# Patient Record
Sex: Male | Born: 1935 | Race: Black or African American | Hispanic: No | Marital: Married | Smoking: Current every day smoker
Health system: Southern US, Community
[De-identification: ages and names within clinical notes are randomized; demographics above are authoritative.]

## PROBLEM LIST (undated history)

## (undated) DIAGNOSIS — M545 Low back pain, unspecified: Secondary | ICD-10-CM

## (undated) DIAGNOSIS — K219 Gastro-esophageal reflux disease without esophagitis: Secondary | ICD-10-CM

## (undated) DIAGNOSIS — R634 Abnormal weight loss: Secondary | ICD-10-CM

## (undated) DIAGNOSIS — I5020 Unspecified systolic (congestive) heart failure: Secondary | ICD-10-CM

## (undated) DIAGNOSIS — I739 Peripheral vascular disease, unspecified: Secondary | ICD-10-CM

## (undated) DIAGNOSIS — M25539 Pain in unspecified wrist: Secondary | ICD-10-CM

## (undated) DIAGNOSIS — D649 Anemia, unspecified: Secondary | ICD-10-CM

## (undated) DIAGNOSIS — M109 Gout, unspecified: Secondary | ICD-10-CM

## (undated) DIAGNOSIS — I10 Essential (primary) hypertension: Secondary | ICD-10-CM

## (undated) DIAGNOSIS — R197 Diarrhea, unspecified: Secondary | ICD-10-CM

## (undated) DIAGNOSIS — I2510 Atherosclerotic heart disease of native coronary artery without angina pectoris: Secondary | ICD-10-CM

## (undated) DIAGNOSIS — R739 Hyperglycemia, unspecified: Secondary | ICD-10-CM

## (undated) DIAGNOSIS — M25439 Effusion, unspecified wrist: Secondary | ICD-10-CM

## (undated) DIAGNOSIS — H547 Unspecified visual loss: Secondary | ICD-10-CM

## (undated) DIAGNOSIS — K4020 Bilateral inguinal hernia, without obstruction or gangrene, not specified as recurrent: Secondary | ICD-10-CM

## (undated) DIAGNOSIS — B029 Zoster without complications: Secondary | ICD-10-CM

## (undated) DIAGNOSIS — K2100 Gastro-esophageal reflux disease with esophagitis, without bleeding: Secondary | ICD-10-CM

## (undated) DIAGNOSIS — I4821 Permanent atrial fibrillation: Secondary | ICD-10-CM

## (undated) DIAGNOSIS — R5382 Chronic fatigue, unspecified: Secondary | ICD-10-CM

## (undated) DIAGNOSIS — Z1331 Encounter for screening for depression: Secondary | ICD-10-CM

## (undated) DIAGNOSIS — E785 Hyperlipidemia, unspecified: Secondary | ICD-10-CM

## (undated) DIAGNOSIS — D519 Vitamin B12 deficiency anemia, unspecified: Secondary | ICD-10-CM

## (undated) DIAGNOSIS — R7303 Prediabetes: Secondary | ICD-10-CM

## (undated) DIAGNOSIS — E7800 Pure hypercholesterolemia, unspecified: Secondary | ICD-10-CM

## (undated) DIAGNOSIS — N390 Urinary tract infection, site not specified: Secondary | ICD-10-CM

## (undated) DIAGNOSIS — R110 Nausea: Secondary | ICD-10-CM

## (undated) DIAGNOSIS — R1013 Epigastric pain: Secondary | ICD-10-CM

## (undated) DIAGNOSIS — I6523 Occlusion and stenosis of bilateral carotid arteries: Secondary | ICD-10-CM

## (undated) DIAGNOSIS — Z79899 Other long term (current) drug therapy: Secondary | ICD-10-CM

## (undated) DIAGNOSIS — J449 Chronic obstructive pulmonary disease, unspecified: Secondary | ICD-10-CM

## (undated) DIAGNOSIS — L03119 Cellulitis of unspecified part of limb: Secondary | ICD-10-CM

## (undated) DIAGNOSIS — J439 Emphysema, unspecified: Secondary | ICD-10-CM

## (undated) HISTORY — DX: Peripheral vascular disease, unspecified: I73.9

## (undated) HISTORY — DX: Gastro-esophageal reflux disease without esophagitis: K21.9

## (undated) HISTORY — DX: Chronic obstructive pulmonary disease, unspecified: J44.9

## (undated) HISTORY — DX: Cellulitis of unspecified part of limb: L03.119

## (undated) HISTORY — DX: Chronic fatigue, unspecified: R53.82

## (undated) HISTORY — DX: Vitamin B12 deficiency anemia, unspecified: D51.9

## (undated) HISTORY — DX: Anemia, unspecified: D64.9

## (undated) HISTORY — DX: Atherosclerotic heart disease of native coronary artery without angina pectoris: I25.10

## (undated) HISTORY — DX: Urinary tract infection, site not specified: N39.0

## (undated) HISTORY — DX: Occlusion and stenosis of bilateral carotid arteries: I65.23

## (undated) HISTORY — DX: Zoster without complications: B02.9

## (undated) HISTORY — DX: Diarrhea, unspecified: R19.7

## (undated) HISTORY — DX: Emphysema, unspecified: J43.9

## (undated) HISTORY — DX: Encounter for screening for depression: Z13.31

## (undated) HISTORY — DX: Nausea: R11.0

## (undated) HISTORY — DX: Other long term (current) drug therapy: Z79.899

## (undated) HISTORY — DX: Unspecified systolic (congestive) heart failure: I50.20

## (undated) HISTORY — DX: Epigastric pain: R10.13

## (undated) HISTORY — DX: Gastro-esophageal reflux disease with esophagitis: K21.0

## (undated) HISTORY — DX: Abnormal weight loss: R63.4

## (undated) HISTORY — DX: Pain in unspecified wrist: M25.539

## (undated) HISTORY — DX: Unspecified visual loss: H54.7

## (undated) HISTORY — DX: Gout, unspecified: M10.9

## (undated) HISTORY — DX: Hyperlipidemia, unspecified: E78.5

## (undated) HISTORY — DX: Permanent atrial fibrillation: I48.21

## (undated) HISTORY — DX: Essential (primary) hypertension: I10

## (undated) HISTORY — PX: OTHER SURGICAL HISTORY: SHX169

## (undated) HISTORY — PX: GALLBLADDER SURGERY: SHX652

## (undated) HISTORY — DX: Bilateral inguinal hernia, without obstruction or gangrene, not specified as recurrent: K40.20

## (undated) HISTORY — DX: Effusion, unspecified wrist: M25.439

## (undated) HISTORY — DX: Hyperglycemia, unspecified: R73.9

## (undated) HISTORY — DX: Low back pain, unspecified: M54.50

## (undated) HISTORY — DX: Low back pain: M54.5

## (undated) HISTORY — DX: Gastro-esophageal reflux disease with esophagitis, without bleeding: K21.00

## (undated) HISTORY — DX: Prediabetes: R73.03

## (undated) HISTORY — DX: Pure hypercholesterolemia, unspecified: E78.00

---

## 2001-12-12 ENCOUNTER — Inpatient Hospital Stay (HOSPITAL_COMMUNITY): Admission: EM | Admit: 2001-12-12 | Discharge: 2001-12-21 | Payer: Self-pay | Admitting: Cardiology

## 2001-12-15 ENCOUNTER — Encounter: Payer: Self-pay | Admitting: Cardiothoracic Surgery

## 2001-12-16 ENCOUNTER — Encounter: Payer: Self-pay | Admitting: Cardiothoracic Surgery

## 2001-12-17 ENCOUNTER — Encounter: Payer: Self-pay | Admitting: Cardiothoracic Surgery

## 2001-12-18 ENCOUNTER — Encounter: Payer: Self-pay | Admitting: Cardiothoracic Surgery

## 2001-12-18 ENCOUNTER — Encounter: Payer: Self-pay | Admitting: Surgery

## 2002-01-14 ENCOUNTER — Encounter: Payer: Self-pay | Admitting: Cardiothoracic Surgery

## 2002-01-14 ENCOUNTER — Encounter: Admission: RE | Admit: 2002-01-14 | Discharge: 2002-01-14 | Payer: Self-pay | Admitting: Cardiothoracic Surgery

## 2018-01-13 DIAGNOSIS — K08409 Partial loss of teeth, unspecified cause, unspecified class: Secondary | ICD-10-CM | POA: Diagnosis not present

## 2018-01-13 DIAGNOSIS — K219 Gastro-esophageal reflux disease without esophagitis: Secondary | ICD-10-CM | POA: Diagnosis not present

## 2018-01-13 DIAGNOSIS — J309 Allergic rhinitis, unspecified: Secondary | ICD-10-CM | POA: Diagnosis not present

## 2018-01-13 DIAGNOSIS — I252 Old myocardial infarction: Secondary | ICD-10-CM | POA: Diagnosis not present

## 2018-01-13 DIAGNOSIS — I739 Peripheral vascular disease, unspecified: Secondary | ICD-10-CM | POA: Diagnosis not present

## 2018-01-13 DIAGNOSIS — G8929 Other chronic pain: Secondary | ICD-10-CM | POA: Diagnosis not present

## 2018-01-13 DIAGNOSIS — E785 Hyperlipidemia, unspecified: Secondary | ICD-10-CM | POA: Diagnosis not present

## 2018-01-13 DIAGNOSIS — I2510 Atherosclerotic heart disease of native coronary artery without angina pectoris: Secondary | ICD-10-CM | POA: Diagnosis not present

## 2018-01-13 DIAGNOSIS — R69 Illness, unspecified: Secondary | ICD-10-CM | POA: Diagnosis not present

## 2018-01-13 DIAGNOSIS — I10 Essential (primary) hypertension: Secondary | ICD-10-CM | POA: Diagnosis not present

## 2018-03-16 DIAGNOSIS — R739 Hyperglycemia, unspecified: Secondary | ICD-10-CM | POA: Diagnosis not present

## 2018-03-16 DIAGNOSIS — R636 Underweight: Secondary | ICD-10-CM | POA: Diagnosis not present

## 2018-03-16 DIAGNOSIS — I10 Essential (primary) hypertension: Secondary | ICD-10-CM | POA: Diagnosis not present

## 2018-03-16 DIAGNOSIS — R5382 Chronic fatigue, unspecified: Secondary | ICD-10-CM | POA: Diagnosis not present

## 2018-03-16 DIAGNOSIS — R002 Palpitations: Secondary | ICD-10-CM | POA: Diagnosis not present

## 2018-03-16 DIAGNOSIS — D649 Anemia, unspecified: Secondary | ICD-10-CM | POA: Diagnosis not present

## 2018-03-16 DIAGNOSIS — E7800 Pure hypercholesterolemia, unspecified: Secondary | ICD-10-CM | POA: Diagnosis not present

## 2018-03-16 DIAGNOSIS — E538 Deficiency of other specified B group vitamins: Secondary | ICD-10-CM | POA: Diagnosis not present

## 2018-03-16 DIAGNOSIS — Z79899 Other long term (current) drug therapy: Secondary | ICD-10-CM | POA: Diagnosis not present

## 2018-03-16 DIAGNOSIS — I2510 Atherosclerotic heart disease of native coronary artery without angina pectoris: Secondary | ICD-10-CM | POA: Diagnosis not present

## 2018-03-16 DIAGNOSIS — K210 Gastro-esophageal reflux disease with esophagitis: Secondary | ICD-10-CM | POA: Diagnosis not present

## 2018-03-16 DIAGNOSIS — M109 Gout, unspecified: Secondary | ICD-10-CM | POA: Diagnosis not present

## 2018-03-16 DIAGNOSIS — N401 Enlarged prostate with lower urinary tract symptoms: Secondary | ICD-10-CM | POA: Diagnosis not present

## 2018-03-17 ENCOUNTER — Encounter: Payer: Self-pay | Admitting: Cardiology

## 2018-03-23 ENCOUNTER — Encounter: Payer: Self-pay | Admitting: Cardiology

## 2018-03-23 ENCOUNTER — Ambulatory Visit (INDEPENDENT_AMBULATORY_CARE_PROVIDER_SITE_OTHER): Payer: Medicare HMO | Admitting: Cardiology

## 2018-03-23 VITALS — BP 100/68 | HR 78 | Ht 73.0 in | Wt 142.8 lb

## 2018-03-23 DIAGNOSIS — R69 Illness, unspecified: Secondary | ICD-10-CM | POA: Diagnosis not present

## 2018-03-23 DIAGNOSIS — I2510 Atherosclerotic heart disease of native coronary artery without angina pectoris: Secondary | ICD-10-CM | POA: Insufficient documentation

## 2018-03-23 DIAGNOSIS — Z951 Presence of aortocoronary bypass graft: Secondary | ICD-10-CM | POA: Insufficient documentation

## 2018-03-23 DIAGNOSIS — E782 Mixed hyperlipidemia: Secondary | ICD-10-CM | POA: Insufficient documentation

## 2018-03-23 DIAGNOSIS — I4891 Unspecified atrial fibrillation: Secondary | ICD-10-CM

## 2018-03-23 DIAGNOSIS — F17210 Nicotine dependence, cigarettes, uncomplicated: Secondary | ICD-10-CM | POA: Diagnosis not present

## 2018-03-23 DIAGNOSIS — I10 Essential (primary) hypertension: Secondary | ICD-10-CM | POA: Diagnosis not present

## 2018-03-23 HISTORY — DX: Atherosclerotic heart disease of native coronary artery without angina pectoris: I25.10

## 2018-03-23 HISTORY — DX: Essential (primary) hypertension: I10

## 2018-03-23 HISTORY — DX: Unspecified atrial fibrillation: I48.91

## 2018-03-23 HISTORY — DX: Presence of aortocoronary bypass graft: Z95.1

## 2018-03-23 HISTORY — DX: Nicotine dependence, cigarettes, uncomplicated: F17.210

## 2018-03-23 HISTORY — DX: Mixed hyperlipidemia: E78.2

## 2018-03-23 MED ORDER — RIVAROXABAN 15 MG PO TABS: 15.0000 mg | ORAL_TABLET | Freq: Every day | ORAL | 0 refills | Status: DC

## 2018-03-23 MED ORDER — DIGOXIN 125 MCG PO TABS: 0.1250 mg | ORAL_TABLET | ORAL | 1 refills | Status: DC

## 2018-03-23 MED ORDER — METOPROLOL SUCCINATE ER 25 MG PO TB24: 25.0000 mg | ORAL_TABLET | Freq: Every day | ORAL | 2 refills | Status: DC

## 2018-03-23 MED ORDER — DIGOXIN 125 MCG PO TABS: 0.1250 mg | ORAL_TABLET | ORAL | 0 refills | Status: DC

## 2018-03-23 MED ORDER — RIVAROXABAN 15 MG PO TABS: 15.0000 mg | ORAL_TABLET | Freq: Every day | ORAL | 2 refills | Status: DC

## 2018-03-23 NOTE — Patient Instructions (Signed)
Medication Instructions:  Your physician has recommended you make the following change in your medication:  START Xarelto 15 mg every night with supper (please do not take this medication until you have been told the stool sample is clear) START digoxin 0.125 mg every other day CHANGE metoprolol to 25 mg daily  Labwork: None  Testing/Procedures: Your physician has requested that you have an echocardiogram. Echocardiography is a painless test that uses sound waves to create images of your heart. It provides your doctor with information about the size and shape of your heart and how well your heart's chambers and valves are working. This procedure takes approximately one hour. There are no restrictions for this procedure.  Your physician has requested that you have a lexiscan myoview. For further information please visit [email or web address] Please follow instruction sheet, as given.  Follow-Up: Your physician recommends that you schedule a follow-up appointment in: 1 week  Any Other Special Instructions Will Be Listed Below (If Applicable).     If you need a refill on your cardiac medications before your next appointment, please call your pharmacy.   [facility]  Garey Ham, RN, BSN  Echocardiogram An echocardiogram, or echocardiography, uses sound waves (ultrasound) to produce an image of your heart. The echocardiogram is simple, painless, obtained within a short period of time, and offers valuable information to your health care provider. The images from an echocardiogram can provide information such as:  Evidence of coronary artery disease (CAD).  Heart size.  Heart muscle function.  Heart valve function.  Aneurysm detection.  Evidence of a past heart attack.  Fluid buildup around the heart.  Heart muscle thickening.  Assess heart valve function.  Tell a health care provider about:  Any allergies you have.  All medicines you are taking, including vitamins,  herbs, eye drops, creams, and over-the-counter medicines.  Any problems you or family members have had with anesthetic medicines.  Any blood disorders you have.  Any surgeries you have had.  Any medical conditions you have.  Whether you are pregnant or may be pregnant. What happens before the procedure? No special preparation is needed. Eat and drink normally. What happens during the procedure?  In order to produce an image of your heart, gel will be applied to your chest and a wand-like tool (transducer) will be moved over your chest. The gel will help transmit the sound waves from the transducer. The sound waves will harmlessly bounce off your heart to allow the heart images to be captured in real-time motion. These images will then be recorded.  You may need an IV to receive a medicine that improves the quality of the pictures. What happens after the procedure? You may return to your normal schedule including diet, activities, and medicines, unless your health care provider tells you otherwise. This information is not intended to replace advice given to you by your health care provider. Make sure you discuss any questions you have with your health care provider. Document Released: 10/03/2000 Document Revised: 05/24/2016 Document Reviewed: 06/13/2013 Elsevier Interactive Patient Education  2017 Elsevier Inc. Rivaroxaban oral tablets What is this medicine? RIVAROXABAN (ri va ROX a ban) is an anticoagulant (blood thinner). It is used to treat blood clots in the lungs or in the veins. It is also used after knee or hip surgeries to prevent blood clots. It is also used to lower the chance of stroke in people with a medical condition called atrial fibrillation. This medicine may be used for other purposes;  ask your health care provider or pharmacist if you have questions. COMMON BRAND NAME(S): Xarelto, Xarelto Starter Pack What should I tell my health care provider before I take this  medicine? They need to know if you have any of these conditions: -bleeding disorders -bleeding in the brain -blood in your stools (black or tarry stools) or if you have blood in your vomit -history of stomach bleeding -kidney disease -liver disease -low blood counts, like low white cell, platelet, or red cell counts -recent or planned spinal or epidural procedure -take medicines that treat or prevent blood clots -an unusual or allergic reaction to rivaroxaban, other medicines, foods, dyes, or preservatives -pregnant or trying to get pregnant -breast-feeding How should I use this medicine? Take this medicine by mouth with a glass of water. Follow the directions on the prescription label. Take your medicine at regular intervals. Do not take it more often than directed. Do not stop taking except on your doctor's advice. Stopping this medicine may increase your risk of a blood clot. Be sure to refill your prescription before you run out of medicine. If you are taking this medicine after hip or knee replacement surgery, take it with or without food. If you are taking this medicine for atrial fibrillation, take it with your evening meal. If you are taking this medicine to treat blood clots, take it with food at the same time each day. If you are unable to swallow your tablet, you may crush the tablet and mix it in applesauce. Then, immediately eat the applesauce. You should eat more food right after you eat the applesauce containing the crushed tablet. Talk to your pediatrician regarding the use of this medicine in children. Special care may be needed. Overdosage: If you think you have taken too much of this medicine contact a poison control center or emergency room at once. NOTE: This medicine is only for you. Do not share this medicine with others. What if I miss a dose? If you take your medicine once a day and miss a dose, take the missed dose as soon as you remember. If you take your medicine  twice a day and miss a dose, take the missed dose immediately. In this instance, 2 tablets may be taken at the same time. The next day you should take 1 tablet twice a day as directed. What may interact with this medicine? Do not take this medicine with any of the following medications: -defibrotide This medicine may also interact with the following medications: -aspirin and aspirin-like medicines -certain antibiotics like erythromycin, azithromycin, and clarithromycin -certain medicines for fungal infections like ketoconazole and itraconazole -certain medicines for irregular heart beat like amiodarone, quinidine, dronedarone -certain medicines for seizures like carbamazepine, phenytoin -certain medicines that treat or prevent blood clots like warfarin, enoxaparin, and dalteparin -conivaptan -diltiazem -felodipine -indinavir -lopinavir; ritonavir -NSAIDS, medicines for pain and inflammation, like ibuprofen or naproxen -ranolazine -rifampin -ritonavir -SNRIs, medicines for depression, like desvenlafaxine, duloxetine, levomilnacipran, venlafaxine -SSRIs, medicines for depression, like citalopram, escitalopram, fluoxetine, fluvoxamine, paroxetine, sertraline -St. John's wort -verapamil This list may not describe all possible interactions. Give your health care provider a list of all the medicines, herbs, non-prescription drugs, or dietary supplements you use. Also tell them if you smoke, drink alcohol, or use illegal drugs. Some items may interact with your medicine. What should I watch for while using this medicine? Visit your doctor or health care professional for regular checks on your progress. Notify your doctor or health care professional and seek  emergency treatment if you develop breathing problems; changes in vision; chest pain; severe, sudden headache; pain, swelling, warmth in the leg; trouble speaking; sudden numbness or weakness of the face, arm or leg. These can be signs that  your condition has gotten worse. If you are going to have surgery or other procedure, tell your doctor that you are taking this medicine. What side effects may I notice from receiving this medicine? Side effects that you should report to your doctor or health care professional as soon as possible: -allergic reactions like skin rash, itching or hives, swelling of the face, lips, or tongue -back pain -redness, blistering, peeling or loosening of the skin, including inside the mouth -signs and symptoms of bleeding such as bloody or black, tarry stools; red or dark-brown urine; spitting up blood or brown material that looks like coffee grounds; red spots on the skin; unusual bruising or bleeding from the eye, gums, or nose Side effects that usually do not require medical attention (report to your doctor or health care professional if they continue or are bothersome): -dizziness -muscle pain This list may not describe all possible side effects. Call your doctor for medical advice about side effects. You may report side effects to FDA at 1-800-FDA-1088. Where should I keep my medicine? Keep out of the reach of children. Store at room temperature between 15 and 30 degrees C (59 and 86 degrees F). Throw away any unused medicine after the expiration date. NOTE: This sheet is a summary. It may not cover all possible information. If you have questions about this medicine, talk to your doctor, pharmacist, or health care provider.  2018 Elsevier/Gold Standard (2016-06-25 16:29:33) Digoxin tablets or capsules What is this medicine? DIGOXIN (di JOX in) is used to treat congestive heart failure and heart rhythm problems. This medicine may be used for other purposes; ask your health care provider or pharmacist if you have questions. COMMON BRAND NAME(S): Digitek, Lanoxicaps, Lanoxin What should I tell my health care provider before I take this medicine? They need to know if you have any of these  conditions: -certain heart rhythm disorders -heart disease or recent heart attack -kidney or liver disease -an unusual or allergic reaction to digoxin, other medicines, foods, dyes, or preservatives -pregnant or trying to get pregnant -breast-feeding How should I use this medicine? Take this medicine by mouth with a glass of water. Follow the directions on the prescription label. Take your doses at regular intervals. Do not take your medicine more often than directed. Talk to your pediatrician regarding the use of this medicine in children. Special care may be needed. Overdosage: If you think you have taken too much of this medicine contact a poison control center or emergency room at once. NOTE: This medicine is only for you. Do not share this medicine with others. What if I miss a dose? If you miss a dose, take it as soon as you can. If it is almost time for your next dose, take only that dose. Do not take double or extra doses. What may interact with this medicine? -activated charcoal -albuterol -alprazolam -antacids -antiviral medicines for HIV or AIDS like ritonavir and saquinavir -calcium -certain antibiotics like azithromycin, clarithromycin, erythromycin, gentamicin, neomycin, trimethoprim, and tetracycline -certain medicines for blood pressure, heart disease, irregular heart beat -certain medicines for cancer -certain medicines for cholesterol like atorvastatin, cholestyramine, and colestipol -certain medicines for diabetes, like acarbose, exenatide, miglitol, and metformin -certain medicines for fungal infections like ketoconazole and itraconazole -certain medicines for  stomach problems like omeprazole, esomeprazole, lansoprazole, rabeprazole, metoclopramide, and sucralfate -conivaptan -cyclosporine -diphenoxylate -epinephrine -kaolin; pectin -nefazodone -NSAIDS, medicines for pain and inflammation, like celecoxib, ibuprofen, or  naproxen -penicillamine -phenytoin -propantheline -quinine -phenytoin -rifampin -succinylcholine -St. John's Wort -sulfasalazine -teriparatide -thyroid hormones -tolvaptan This list may not describe all possible interactions. Give your health care provider a list of all the medicines, herbs, non-prescription drugs, or dietary supplements you use. Also tell them if you smoke, drink alcohol, or use illegal drugs. Some items may interact with your medicine. What should I watch for while using this medicine? Visit your doctor or health care professional for regular checks on your progress. Do not stop taking this medicine without the advice of your doctor or health care professional, even if you feel better. Do not change the brand you are taking, other brands may affect you differently. Check your heart rate and blood pressure regularly while you are taking this medicine. Ask your doctor or health care professional what your heart rate and blood pressure should be, and when you should contact him or her. Your doctor or health care professional also may schedule regular blood tests and electrocardiograms to check your progress. Watch your diet. Less digoxin may be absorbed from the stomach if you have a diet high in bran fiber. Do not treat yourself for coughs, colds or allergies without asking your doctor or health care professional for advice. Some ingredients can increase possible side effects. What side effects may I notice from receiving this medicine? Side effects that you should report to your doctor or health care professional as soon as possible: -allergic reactions like skin rash, itching or hives, swelling of the face, lips, or tongue -changes in behavior, mood, or mental ability -changes in vision -confusion -fast, irregular heartbeat -feeling faint or lightheaded, falls -headache -nausea, vomiting -unusual bleeding, bruising -unusually weak or tired Side effects that usually  do not require medical attention (report to your doctor or health care professional if they continue or are bothersome): -breast enlargement in men and women -diarrhea This list may not describe all possible side effects. Call your doctor for medical advice about side effects. You may report side effects to FDA at 1-800-FDA-1088. Where should I keep my medicine? Keep out of the reach of children. Store at room temperature between 15 and 30 degrees C (59 and 86 degrees F). Protect from light and moisture. Throw away any unused medicine after the expiration date. NOTE: This sheet is a summary. It may not cover all possible information. If you have questions about this medicine, talk to your doctor, pharmacist, or health care provider.  2018 Elsevier/Gold Standard (2016-09-24 15:40:26)

## 2018-03-23 NOTE — Progress Notes (Signed)
Cardiology Office Note:    Date:  03/23/2018   ID:  LAKER THOMPSON, DOB 1936-08-04, MRN [id number]  PCP:  Guadalupe Maple., MD  Cardiologist:  Garwin Brothers, MD   Referring MD: Guadalupe Maple., MD    ASSESSMENT:    1. Atrial fibrillation, unspecified type (HCC)   2. Coronary artery disease involving native coronary artery of native heart without angina pectoris   3. Hx of CABG   4. Essential hypertension   5. Mixed dyslipidemia   6. Cigarette smoker    PLAN:    In order of problems listed above:   Secondary prevention stressed with the patient.  Importance of compliance with diet and medications stressed and he vocalized understanding. I spent 5 minutes with the patient discussing solely about smoking. Smoking cessation was counseled. I suggested to the patient also different medications and pharmacological interventions. Patient is keen to try stopping on its own at this time. He will get back to me if he needs any further assistance in this matter. In view of history of coronary artery disease he will undergo Lexiscan sestamibi testing.  Echocardiogram will be done to assess murmur heard on auscultation. I discussed with the patient atrial fibrillation, disease process. Management and therapy including rate and rhythm control, anticoagulation benefits and potential risks were discussed extensively with the patient. Patient had multiple questions which were answered to patient's satisfaction. He has been initiated on 15 mg of Xarelto on a daily basis.  He mentions to me that he has no history of bleeding from any part of the body.  We will check his stool for occult blood.  He will also keep a close monitoring this. Patient's metoprolol has been reduced to half dose in view of what appears to be symptoms suggesting postural hypotension.  Fall precautions were advised.  I started him on digoxin 0.125 mg on every other day.  I am aware of the fact that he has renal insufficiency and  therefore I am starting him on a smaller dose. He will be seen in follow-up appointment in a week or earlier if he has any concerns he knows to go to the nearest emergency room for any significant concerns.  His lipids will be managed by his primary care physician.   Medication Adjustments/Labs and Tests Ordered: Current medicines are reviewed at length with the patient today.  Concerns regarding medicines are outlined above.  No orders of the defined types were placed in this encounter.  No orders of the defined types were placed in this encounter.    History of Present Illness:    Colin Reeves is a 82 y.o. male who is being seen today for the evaluation of newly diagnosed atrial fibrillation at the request of Guadalupe Maple., MD.  Patient is a pleasant 82 year old male.  He has past medical history of essential hypertension and coronary artery disease.  He is undergone CABG surgery in the remote past.  He mentions to me that he was diagnosed to have irregular heartbeat and sent here for evaluation.  No chest pain orthopnea or PND.  He mentions to me that when he sits up he feels a little lightheaded.  No history of syncope.  No orthopnea or PND.  No chest pain.  At the time of my evaluation, the patient is alert awake oriented and in no distress.  He is seen with his granddaughter at this visit.  He has history of renal insufficiency.  Unfortunately is a  chronic smoker and continues to smoke.  Past Medical History:  Diagnosis Date  . Acute lower urinary tract infection   . Anemia of unknown etiology   . Anemia, vitamin B12 deficiency   . CAD (coronary artery disease), native coronary artery   . Cellulitis of arm   . Chronic fatigue   . Chronic reflux esophagitis   . COPD, moderate (HCC)   . Depression screening   . Diarrhea   . Elevated blood sugar   . Epigastric pain   . Esophageal reflux   . Gout   . Hyperlipidemia   . Hypertension, essential, benign   . HZ (herpes zoster)    . Inguinal hernia, bilateral   . Long-term use of high-risk medication   . Lumbago   . Nausea   . Pure hypercholesterolemia   . Vision loss   . Weight loss   . Wrist joint pain   . Wrist swelling     Past Surgical History:  Procedure Laterality Date  . GALLBLADDER SURGERY    . triple bypass      Current Medications: Current Meds  Medication Sig  . allopurinol (ZYLOPRIM) 300 MG tablet Take 300 mg by mouth daily.  Marland Kitchen aspirin 81 MG EC tablet Take 81 mg by mouth daily. Swallow whole.  Marland Kitchen atorvastatin (LIPITOR) 80 MG tablet Take 80 mg by mouth daily.  . colchicine 0.6 MG tablet Take 0.6 mg by mouth 4 (four) times daily.  Marland Kitchen loperamide (IMODIUM) 2 MG capsule Take 2 mg by mouth every 4 (four) hours as needed for diarrhea or loose stools.  . metoprolol succinate (TOPROL-XL) 50 MG 24 hr tablet Take 50 mg by mouth daily. Take with or immediately following a meal.  . nitroGLYCERIN (NITROSTAT) 0.4 MG SL tablet Place 0.4 mg under the tongue every 5 (five) minutes as needed.  Marland Kitchen omeprazole (PRILOSEC) 20 MG capsule Take 20 mg by mouth 2 (two) times daily.  . ondansetron (ZOFRAN) 4 MG tablet Take 4 mg by mouth every 4 (four) hours as needed for nausea.  . tamsulosin (FLOMAX) 0.4 MG CAPS capsule Take 0.4 mg by mouth daily.     Allergies:   Patient has no known allergies.   Social History   Socioeconomic History  . Marital status: Married    Spouse name: Not on file  . Number of children: Not on file  . Years of education: Not on file  . Highest education level: Not on file  Occupational History  . Not on file  Social Needs  . Financial resource strain: Not on file  . Food insecurity:    Worry: Not on file    Inability: Not on file  . Transportation needs:    Medical: Not on file    Non-medical: Not on file  Tobacco Use  . Smoking status: Current Every Day Smoker  . Smokeless tobacco: Never Used  Substance and Sexual Activity  . Alcohol use: Not on file  . Drug use: Not on file    . Sexual activity: Not on file  Lifestyle  . Physical activity:    Days per week: Not on file    Minutes per session: Not on file  . Stress: Not on file  Relationships  . Social connections:    Talks on phone: Not on file    Gets together: Not on file    Attends religious service: Not on file    Active member of club or organization: Not on file  Attends meetings of clubs or organizations: Not on file    Relationship status: Not on file  Other Topics Concern  . Not on file  Social History Narrative  . Not on file     Family History: The patient's family history includes Cancer in his mother; Diabetes in his father and sister; Throat cancer in his brother.  ROS:   Please see the history of present illness.    All other systems reviewed and are negative.  EKGs/Labs/Other Studies Reviewed:    The following studies were reviewed today: I discussed my evaluation of records from primary care physician office.   Recent Labs: No results found for requested labs within last 8760 hours.  Recent Lipid Panel No results found for: CHOL, TRIG, HDL, CHOLHDL, VLDL, LDLCALC, LDLDIRECT  Physical Exam:    VS:  BP 100/68 (BP Location: Right Arm, Patient Position: Sitting, Cuff Size: Normal)   Pulse 78   Ht 6\' 1"  (1.854 m)   Wt 142 lb 12.8 oz (64.8 kg)   SpO2 97%   BMI 18.84 kg/m     Wt Readings from Last 3 Encounters:  03/23/18 142 lb 12.8 oz (64.8 kg)     GEN: Patient is in no acute distress HEENT: Normal NECK: No JVD; No carotid bruits LYMPHATICS: No lymphadenopathy CARDIAC: S1 S2 irregular, 2/6 systolic murmur at the apex. RESPIRATORY:  Clear to auscultation without rales, wheezing or rhonchi  ABDOMEN: Soft, non-tender, non-distended MUSCULOSKELETAL:  No edema; No deformity  SKIN: Warm and dry NEUROLOGIC:  Alert and oriented x 3 PSYCHIATRIC:  Normal affect    Signed, Garwin Brothersajan R Brienne Liguori, MD  03/23/2018 11:27 AM    [facility]

## 2018-03-23 NOTE — Addendum Note (Signed)
Addended by: Craige CottaANDERSON, ASHLEY S on: 03/23/2018 11:55 AM   Modules accepted: Orders

## 2018-03-24 ENCOUNTER — Telehealth (HOSPITAL_COMMUNITY): Payer: Self-pay | Admitting: *Deleted

## 2018-03-24 NOTE — Telephone Encounter (Signed)
Daughter of patient given detailed instructions per DPR of Myocardial Perfusion Study Information Sheet for the test on 03/26/18 at 10:15. Patient notified to arrive 15 minutes early and that it is imperative to arrive on time for appointment to keep from having the test rescheduled.  If you need to cancel or reschedule your appointment, please call the office within 24 hours of your appointment. . Patient verbalized understanding.Colin DolinSharon S Brooks

## 2018-03-26 ENCOUNTER — Ambulatory Visit (HOSPITAL_COMMUNITY): Payer: Medicare HMO | Attending: Internal Medicine

## 2018-03-26 ENCOUNTER — Other Ambulatory Visit: Payer: Self-pay

## 2018-03-26 ENCOUNTER — Ambulatory Visit (HOSPITAL_BASED_OUTPATIENT_CLINIC_OR_DEPARTMENT_OTHER): Payer: Medicare HMO

## 2018-03-26 VITALS — Ht 73.0 in | Wt 142.0 lb

## 2018-03-26 DIAGNOSIS — I2510 Atherosclerotic heart disease of native coronary artery without angina pectoris: Secondary | ICD-10-CM

## 2018-03-26 DIAGNOSIS — I4891 Unspecified atrial fibrillation: Secondary | ICD-10-CM | POA: Diagnosis not present

## 2018-03-26 DIAGNOSIS — I081 Rheumatic disorders of both mitral and tricuspid valves: Secondary | ICD-10-CM | POA: Diagnosis not present

## 2018-03-26 DIAGNOSIS — Z720 Tobacco use: Secondary | ICD-10-CM | POA: Insufficient documentation

## 2018-03-26 DIAGNOSIS — Z951 Presence of aortocoronary bypass graft: Secondary | ICD-10-CM

## 2018-03-26 DIAGNOSIS — I519 Heart disease, unspecified: Secondary | ICD-10-CM | POA: Insufficient documentation

## 2018-03-26 LAB — MYOCARDIAL PERFUSION IMAGING
CHL CUP NUCLEAR SDS: 0
CHL CUP NUCLEAR SSS: 3
CSEPPHR: 125 {beats}/min
LV dias vol: 104 mL (ref 62–150)
LV sys vol: 67 mL
NUC STRESS TID: 0.89
RATE: 0.29
Rest HR: 82 {beats}/min
SRS: 3

## 2018-03-26 IMAGING — NM NM MISC PROCEDURE
5 series · 30 of 30 positions shown · non-contrast
Comparison: none

[Series 1: wbr_r-proj_st rest · 6.51mm/px · 6 of 64 frames shown]
[frame 6/64]
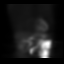
[frame 16/64]
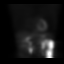
[frame 27/64]
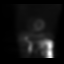
[frame 38/64]
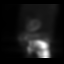
[frame 48/64]
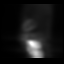
[frame 59/64]
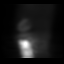

[Series 1: rest · 6.51mm/px · 6 of 64 frames shown]
[frame 6/64]
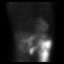
[frame 16/64]
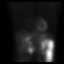
[frame 27/64]
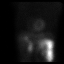
[frame 38/64]
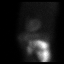
[frame 48/64]
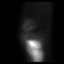
[frame 59/64]
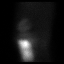

[Series 2: stress - gated · 6.51mm/px · 6 of 512 frames shown]
[frame 43/512]
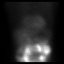
[frame 128/512]
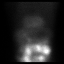
[frame 214/512]
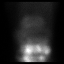
[frame 299/512]
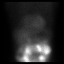
[frame 384/512]
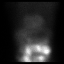
[frame 470/512]
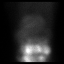

[Series 2: wbr_s-proj_st stress - gated · 6.51mm/px · 6 of 512 frames shown]
[frame 43/512]
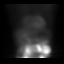
[frame 128/512]
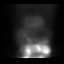
[frame 214/512]
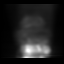
[frame 299/512]
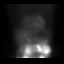
[frame 384/512]
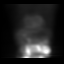
[frame 470/512]
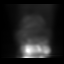

[Series 3: stress - perfusion · 6.51mm/px · 6 of 64 frames shown]
[frame 6/64]
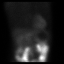
[frame 16/64]
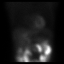
[frame 27/64]
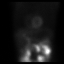
[frame 38/64]
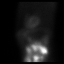
[frame 48/64]
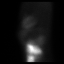
[frame 59/64]
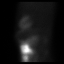

[30 of 30 positions shown; findings below may reference images not displayed]

Canned report from images found in remote index.

Refer to host system for actual result text.

## 2018-03-26 MED ORDER — TECHNETIUM TC 99M TETROFOSMIN IV KIT
10.9000 | PACK | Freq: Once | INTRAVENOUS | Status: AC | PRN
  Administered 2018-03-26: 10.9 via INTRAVENOUS
  Filled 2018-03-26: qty 11

## 2018-03-26 MED ORDER — TECHNETIUM TC 99M TETROFOSMIN IV KIT
30.1000 | PACK | Freq: Once | INTRAVENOUS | Status: AC | PRN
  Administered 2018-03-26: 30.1 via INTRAVENOUS
  Filled 2018-03-26: qty 31

## 2018-03-26 MED ORDER — REGADENOSON 0.4 MG/5ML IV SOLN
0.4000 mg | Freq: Once | INTRAVENOUS | Status: AC
  Administered 2018-03-26: 0.4 mg via INTRAVENOUS

## 2018-03-29 ENCOUNTER — Telehealth: Payer: Self-pay | Admitting: Cardiology

## 2018-03-29 ENCOUNTER — Telehealth: Payer: Self-pay

## 2018-03-29 NOTE — Telephone Encounter (Signed)
Patient informed of results of echocardiogram and stress test. Advised patient Dr. Tomie Chinaevankar would discuss results in detail at appointment tomorrow morning. Patient verbalized understanding. No further questions.

## 2018-03-29 NOTE — Telephone Encounter (Signed)
-----   Message from Garwin Brothersajan R Revankar, MD sent at 03/26/2018  4:58 PM EDT ----- His ejection fraction is better by echocardiogram.  No change in therapy at this time.  I think I am seeing him fairly soon an appointment and will discuss details.  Cc PCP Garwin Brothersajan R Revankar, MD 03/26/2018 4:58 PM

## 2018-03-29 NOTE — Telephone Encounter (Addendum)
Attempted to contact patient for results of stress test and echocardiogram. No answer, no voicemail. If patient does not return call, has an appointment scheduled tomorrow with Dr. Tomie Chinaevankar and will discuss then.

## 2018-03-29 NOTE — Telephone Encounter (Signed)
See previous telephone encounter.

## 2018-03-29 NOTE — Telephone Encounter (Signed)
Patient returned your call for results °

## 2018-03-30 ENCOUNTER — Ambulatory Visit (INDEPENDENT_AMBULATORY_CARE_PROVIDER_SITE_OTHER): Payer: Medicare HMO | Admitting: Cardiology

## 2018-03-30 ENCOUNTER — Encounter: Payer: Self-pay | Admitting: Cardiology

## 2018-03-30 VITALS — BP 112/72 | HR 73 | Ht 73.0 in | Wt 144.0 lb

## 2018-03-30 DIAGNOSIS — I2510 Atherosclerotic heart disease of native coronary artery without angina pectoris: Secondary | ICD-10-CM

## 2018-03-30 DIAGNOSIS — I4891 Unspecified atrial fibrillation: Secondary | ICD-10-CM

## 2018-03-30 DIAGNOSIS — F17210 Nicotine dependence, cigarettes, uncomplicated: Secondary | ICD-10-CM | POA: Diagnosis not present

## 2018-03-30 DIAGNOSIS — Z951 Presence of aortocoronary bypass graft: Secondary | ICD-10-CM

## 2018-03-30 DIAGNOSIS — I10 Essential (primary) hypertension: Secondary | ICD-10-CM | POA: Diagnosis not present

## 2018-03-30 DIAGNOSIS — R69 Illness, unspecified: Secondary | ICD-10-CM | POA: Diagnosis not present

## 2018-03-30 DIAGNOSIS — E782 Mixed hyperlipidemia: Secondary | ICD-10-CM | POA: Diagnosis not present

## 2018-03-30 NOTE — Patient Instructions (Addendum)
Medication Instructions:  Your physician recommends that you continue on your current medications as directed. Please refer to the Current Medication list given to you today.  Please START xarelto 15 mg daily today Please START digoxin 0.125 mg every other day today  Labwork: None  Testing/Procedures: None  Follow-Up: Your physician recommends that you schedule a follow-up appointment in: 2 weeks  Any Other Special Instructions Will Be Listed Below (If Applicable).     If you need a refill on your cardiac medications before your next appointment, please call your pharmacy.   [facility]  Garey HamAshley A, RN, BSN

## 2018-03-30 NOTE — Progress Notes (Signed)
Cardiology Office Note:    Date:  03/30/2018   ID:  Colin Reeves, DOB 12/13/1935, MRN [id number]  PCP:  Guadalupe Maple., MD  Cardiologist:  Garwin Brothers, MD   Referring MD: Guadalupe Maple., MD    ASSESSMENT:    No diagnosis found. PLAN:    In order of problems listed above:  1. Secondary prevention stressed with the patient.  Importance of compliance with diet and medications stressed and he vocalized understanding.  His blood pressure is stable and better than last time. 2. Echocardiogram and stress test report were discussed with the patient at length. 3. His blood pressure is stable.  I have asked him to initiate digoxin 0.125 mg every other day and he is agreeable to that.  He will be seen in follow-up appointment in 2 weeks or earlier if he has any concerns.  His stool guaiac results are yet to be available.  His hemoglobin appears stable and he has not noticed any black stools or any form of blood loss from any orifice in the body and therefore I have asked him to initiate Xarelto 15 mg daily.   Medication Adjustments/Labs and Tests Ordered: Current medicines are reviewed at length with the patient today.  Concerns regarding medicines are outlined above.  No orders of the defined types were placed in this encounter.  No orders of the defined types were placed in this encounter.    No chief complaint on file.    History of Present Illness:    Colin Reeves is a 82 y.o. male.  The patient was evaluated by me for atrial fibrillation.  Unfortunately he has not taken his digoxin and did not pick up the medicine from the pharmacy.  He denies any problems at this time he is family member is with him today.  He tells me that he is kept himself well-hydrated and is taking better care of himself and he feels much better.  No chest pain orthopnea or PND.  Time  Past Medical History:  Diagnosis Date  . Acute lower urinary tract infection   . Anemia of unknown etiology    . Anemia, vitamin B12 deficiency   . CAD (coronary artery disease), native coronary artery   . Cellulitis of arm   . Chronic fatigue   . Chronic reflux esophagitis   . COPD, moderate (HCC)   . Depression screening   . Diarrhea   . Elevated blood sugar   . Epigastric pain   . Esophageal reflux   . Gout   . Hyperlipidemia   . Hypertension, essential, benign   . HZ (herpes zoster)   . Inguinal hernia, bilateral   . Long-term use of high-risk medication   . Lumbago   . Nausea   . Pure hypercholesterolemia   . Vision loss   . Weight loss   . Wrist joint pain   . Wrist swelling     Past Surgical History:  Procedure Laterality Date  . GALLBLADDER SURGERY    . triple bypass      Current Medications: Current Meds  Medication Sig  . allopurinol (ZYLOPRIM) 300 MG tablet Take 300 mg by mouth daily.  Marland Kitchen aspirin 81 MG EC tablet Take 81 mg by mouth daily. Swallow whole.  Marland Kitchen atorvastatin (LIPITOR) 80 MG tablet Take 80 mg by mouth daily.  . colchicine 0.6 MG tablet Take 0.6 mg by mouth 4 (four) times daily.  . digoxin (LANOXIN) 0.125 MG tablet Take 1 tablet (  0.125 mg total) by mouth every other day.  . loperamide (IMODIUM) 2 MG capsule Take 2 mg by mouth every 4 (four) hours as needed for diarrhea or loose stools.  . metoprolol succinate (TOPROL-XL) 25 MG 24 hr tablet Take 1 tablet (25 mg total) by mouth daily.  . nitroGLYCERIN (NITROSTAT) 0.4 MG SL tablet Place 0.4 mg under the tongue every 5 (five) minutes as needed.  Marland Kitchen omeprazole (PRILOSEC) 20 MG capsule Take 20 mg by mouth 2 (two) times daily.  . ondansetron (ZOFRAN) 4 MG tablet Take 4 mg by mouth every 4 (four) hours as needed for nausea.  . Rivaroxaban (XARELTO) 15 MG TABS tablet Take 1 tablet (15 mg total) by mouth daily with supper.  . tamsulosin (FLOMAX) 0.4 MG CAPS capsule Take 0.4 mg by mouth daily.     Allergies:   Patient has no known allergies.   Social History   Socioeconomic History  . Marital status: Married     Spouse name: Not on file  . Number of children: Not on file  . Years of education: Not on file  . Highest education level: Not on file  Occupational History  . Not on file  Social Needs  . Financial resource strain: Not on file  . Food insecurity:    Worry: Not on file    Inability: Not on file  . Transportation needs:    Medical: Not on file    Non-medical: Not on file  Tobacco Use  . Smoking status: Current Every Day Smoker  . Smokeless tobacco: Never Used  Substance and Sexual Activity  . Alcohol use: Not on file  . Drug use: Not on file  . Sexual activity: Not on file  Lifestyle  . Physical activity:    Days per week: Not on file    Minutes per session: Not on file  . Stress: Not on file  Relationships  . Social connections:    Talks on phone: Not on file    Gets together: Not on file    Attends religious service: Not on file    Active member of club or organization: Not on file    Attends meetings of clubs or organizations: Not on file    Relationship status: Not on file  Other Topics Concern  . Not on file  Social History Narrative  . Not on file     Family History: The patient's family history includes Cancer in his mother; Diabetes in his father and sister; Throat cancer in his brother.  ROS:   Please see the history of present illness.    All other systems reviewed and are negative.  EKGs/Labs/Other Studies Reviewed:    The following studies were reviewed today: EKG reveals atrial fibrillation with nonspecific ST-T changes.  Results of stress test and echocardiogram were discussed with the patient at length.   Recent Labs: No results found for requested labs within last 8760 hours.  Recent Lipid Panel No results found for: CHOL, TRIG, HDL, CHOLHDL, VLDL, LDLCALC, LDLDIRECT  Physical Exam:    VS:  BP 112/72 (BP Location: Right Arm, Patient Position: Sitting, Cuff Size: Normal)   Pulse 73   Ht 6\' 1"  (1.854 m)   Wt 144 lb (65.3 kg)   SpO2 98%    BMI 19.00 kg/m     Wt Readings from Last 3 Encounters:  03/30/18 144 lb (65.3 kg)  03/26/18 142 lb (64.4 kg)  03/23/18 142 lb 12.8 oz (64.8 kg)  GEN: Patient is in no acute distress HEENT: Normal NECK: No JVD; No carotid bruits LYMPHATICS: No lymphadenopathy CARDIAC: Hear sounds regular, 2/6 systolic murmur at the apex. RESPIRATORY:  Clear to auscultation without rales, wheezing or rhonchi  ABDOMEN: Soft, non-tender, non-distended MUSCULOSKELETAL:  No edema; No deformity  SKIN: Warm and dry NEUROLOGIC:  Alert and oriented x 3 PSYCHIATRIC:  Normal affect   Signed, Garwin Brothersajan R Revankar, MD  03/30/2018 11:01 AM    [facility]

## 2018-04-15 ENCOUNTER — Encounter: Payer: Self-pay | Admitting: Cardiology

## 2018-04-15 ENCOUNTER — Ambulatory Visit: Payer: Medicare HMO | Admitting: Cardiology

## 2018-04-15 VITALS — BP 120/60 | HR 65 | Ht 73.0 in | Wt 144.0 lb

## 2018-04-15 DIAGNOSIS — E782 Mixed hyperlipidemia: Secondary | ICD-10-CM

## 2018-04-15 DIAGNOSIS — N401 Enlarged prostate with lower urinary tract symptoms: Secondary | ICD-10-CM | POA: Diagnosis not present

## 2018-04-15 DIAGNOSIS — Z951 Presence of aortocoronary bypass graft: Secondary | ICD-10-CM

## 2018-04-15 DIAGNOSIS — R69 Illness, unspecified: Secondary | ICD-10-CM | POA: Diagnosis not present

## 2018-04-15 DIAGNOSIS — F17210 Nicotine dependence, cigarettes, uncomplicated: Secondary | ICD-10-CM | POA: Diagnosis not present

## 2018-04-15 DIAGNOSIS — Z9181 History of falling: Secondary | ICD-10-CM | POA: Diagnosis not present

## 2018-04-15 DIAGNOSIS — R0602 Shortness of breath: Secondary | ICD-10-CM | POA: Diagnosis not present

## 2018-04-15 DIAGNOSIS — I2510 Atherosclerotic heart disease of native coronary artery without angina pectoris: Secondary | ICD-10-CM | POA: Diagnosis not present

## 2018-04-15 DIAGNOSIS — I482 Chronic atrial fibrillation, unspecified: Secondary | ICD-10-CM

## 2018-04-15 DIAGNOSIS — I4891 Unspecified atrial fibrillation: Secondary | ICD-10-CM | POA: Diagnosis not present

## 2018-04-15 DIAGNOSIS — I10 Essential (primary) hypertension: Secondary | ICD-10-CM | POA: Diagnosis not present

## 2018-04-15 DIAGNOSIS — Z681 Body mass index (BMI) 19 or less, adult: Secondary | ICD-10-CM | POA: Diagnosis not present

## 2018-04-15 DIAGNOSIS — Z1331 Encounter for screening for depression: Secondary | ICD-10-CM | POA: Diagnosis not present

## 2018-04-15 NOTE — Patient Instructions (Signed)
Medication Instructions:  Your physician recommends that you continue on your current medications as directed. Please refer to the Current Medication list given to you today.  Labwork: None  Testing/Procedures: None  Follow-Up: Your physician recommends that you schedule a follow-up appointment in: 6 months  Any Other Special Instructions Will Be Listed Below (If Applicable).     If you need a refill on your cardiac medications before your next appointment, please call your pharmacy.   [facility]  Vastie Douty A, RN, BSN  

## 2018-04-15 NOTE — Progress Notes (Signed)
Cardiology Office Note:    Date:  04/15/2018   ID:  Colin Reeves, DOB 12-20-1935, MRN [id number]  PCP:  Colin MapleGage, John F., MD  Cardiologist:  Colin Brothersajan R Revankar, MD   Referring MD: Colin MapleGage, John F., MD    ASSESSMENT:    1. Coronary artery disease involving native coronary artery of native heart without angina pectoris   2. Essential hypertension   3. Chronic atrial fibrillation (HCC)   4. Hx of CABG   5. Mixed dyslipidemia   6. Cigarette smoker    PLAN:    In order of problems listed above:  1. Secondary prevention stressed with the patient.  Importance of compliance with diet and medications stressed and he vocalized understanding.  His blood pressure is stable.  He is not telling me that he takes his medications very regularly. 2. I discussed my findings with the patient and his daughter about results of the echocardiogram and stress test.  We will continue digoxin.  In view of her shortness of breath issues I have asked him to quit smoking and also get established with a pulmonologist for complete evaluation. 3. I spent 5 minutes with the patient discussing solely about smoking. Smoking cessation was counseled. I suggested to the patient also different medications and pharmacological interventions. Patient is keen to try stopping on its own at this time. He will get back to me if he needs any further assistance in this matter. 4. Patient will be seen in follow-up appointment in 6 months or earlier if the patient has any concerns    Medication Adjustments/Labs and Tests Ordered: Current medicines are reviewed at length with the patient today.  Concerns regarding medicines are outlined above.  Orders Placed This Encounter  Procedures  . Fecal occult blood, imunochemical   No orders of the defined types were placed in this encounter.    Chief Complaint  Patient presents with  . 2 Week Follow-up    Still having dizziness and Shortness of breath     History of Present  Illness:    Colin Reeves is a 82 y.o. male.  Patient has history of coronary artery disease and atrial fibrillation.  Unfortunately he continues to smoke.  He denies any problems at this time and takes care of activities of daily living.  No chest pain orthopnea or PND.  He tells me that his palpitations are better.  He does have shortness of breath on exertion which has been steady over the past several months to years.  He does not have a pulmonologist.  Past Medical History:  Diagnosis Date  . Acute lower urinary tract infection   . Anemia of unknown etiology   . Anemia, vitamin B12 deficiency   . CAD (coronary artery disease), native coronary artery   . Cellulitis of arm   . Chronic fatigue   . Chronic reflux esophagitis   . COPD, moderate (HCC)   . Depression screening   . Diarrhea   . Elevated blood sugar   . Epigastric pain   . Esophageal reflux   . Gout   . Hyperlipidemia   . Hypertension, essential, benign   . HZ (herpes zoster)   . Inguinal hernia, bilateral   . Long-term use of high-risk medication   . Lumbago   . Nausea   . Pure hypercholesterolemia   . Vision loss   . Weight loss   . Wrist joint pain   . Wrist swelling     Past Surgical History:  Procedure Laterality Date  . GALLBLADDER SURGERY    . triple bypass      Current Medications: Current Meds  Medication Sig  . allopurinol (ZYLOPRIM) 300 MG tablet Take 300 mg by mouth daily.  Marland Kitchen aspirin 81 MG EC tablet Take 81 mg by mouth daily. Swallow whole.  Marland Kitchen atorvastatin (LIPITOR) 80 MG tablet Take 80 mg by mouth daily.  . colchicine 0.6 MG tablet Take 0.6 mg by mouth 4 (four) times daily.  . digoxin (LANOXIN) 0.125 MG tablet Take 1 tablet (0.125 mg total) by mouth every other day.  . loperamide (IMODIUM) 2 MG capsule Take 2 mg by mouth every 4 (four) hours as needed for diarrhea or loose stools.  . metoprolol succinate (TOPROL-XL) 25 MG 24 hr tablet Take 1 tablet (25 mg total) by mouth daily.  .  nitroGLYCERIN (NITROSTAT) 0.4 MG SL tablet Place 0.4 mg under the tongue every 5 (five) minutes as needed.  Marland Kitchen omeprazole (PRILOSEC) 20 MG capsule Take 20 mg by mouth 2 (two) times daily.  . ondansetron (ZOFRAN) 4 MG tablet Take 4 mg by mouth every 4 (four) hours as needed for nausea.  . Rivaroxaban (XARELTO) 15 MG TABS tablet Take 1 tablet (15 mg total) by mouth daily with supper.  . tamsulosin (FLOMAX) 0.4 MG CAPS capsule Take 0.4 mg by mouth daily.     Allergies:   Patient has no known allergies.   Social History   Socioeconomic History  . Marital status: Married    Spouse name: Not on file  . Number of children: Not on file  . Years of education: Not on file  . Highest education level: Not on file  Occupational History  . Not on file  Social Needs  . Financial resource strain: Not on file  . Food insecurity:    Worry: Not on file    Inability: Not on file  . Transportation needs:    Medical: Not on file    Non-medical: Not on file  Tobacco Use  . Smoking status: Current Every Day Smoker  . Smokeless tobacco: Never Used  Substance and Sexual Activity  . Alcohol use: Not on file  . Drug use: Not on file  . Sexual activity: Not on file  Lifestyle  . Physical activity:    Days per week: Not on file    Minutes per session: Not on file  . Stress: Not on file  Relationships  . Social connections:    Talks on phone: Not on file    Gets together: Not on file    Attends religious service: Not on file    Active member of club or organization: Not on file    Attends meetings of clubs or organizations: Not on file    Relationship status: Not on file  Other Topics Concern  . Not on file  Social History Narrative  . Not on file     Family History: The patient's family history includes Cancer in his mother; Diabetes in his father and sister; Throat cancer in his brother.  ROS:   Please see the history of present illness.    All other systems reviewed and are  negative.  EKGs/Labs/Other Studies Reviewed:    The following studies were reviewed today: I discussed the findings of the echocardiogram and stress test with the patient at extensive length and he and his daughter had multiple questions which were answered to their satisfaction.   Recent Labs: No results found for requested labs within  last 8760 hours.  Recent Lipid Panel No results found for: CHOL, TRIG, HDL, CHOLHDL, VLDL, LDLCALC, LDLDIRECT  Physical Exam:    VS:  BP 120/60   Pulse 65   Ht 6\' 1"  (1.854 m)   Wt 144 lb (65.3 kg)   SpO2 98%   BMI 19.00 kg/m     Wt Readings from Last 3 Encounters:  04/15/18 144 lb (65.3 kg)  03/30/18 144 lb (65.3 kg)  03/26/18 142 lb (64.4 kg)     GEN: Patient is in no acute distress HEENT: Normal NECK: No JVD; No carotid bruits LYMPHATICS: No lymphadenopathy CARDIAC: Hear sounds regular, 2/6 systolic murmur at the apex. RESPIRATORY:  Clear to auscultation without rales, wheezing or rhonchi  ABDOMEN: Soft, non-tender, non-distended MUSCULOSKELETAL:  No edema; No deformity  SKIN: Warm and dry NEUROLOGIC:  Alert and oriented x 3 PSYCHIATRIC:  Normal affect   Signed, Colin Brothers, MD  04/15/2018 11:11 AM    [facility]

## 2018-04-28 DIAGNOSIS — N183 Chronic kidney disease, stage 3 (moderate): Secondary | ICD-10-CM | POA: Diagnosis not present

## 2018-04-28 DIAGNOSIS — Z681 Body mass index (BMI) 19 or less, adult: Secondary | ICD-10-CM | POA: Diagnosis not present

## 2018-04-28 DIAGNOSIS — J449 Chronic obstructive pulmonary disease, unspecified: Secondary | ICD-10-CM | POA: Diagnosis not present

## 2018-04-28 DIAGNOSIS — Z1339 Encounter for screening examination for other mental health and behavioral disorders: Secondary | ICD-10-CM | POA: Diagnosis not present

## 2018-04-28 DIAGNOSIS — N401 Enlarged prostate with lower urinary tract symptoms: Secondary | ICD-10-CM | POA: Diagnosis not present

## 2018-04-28 DIAGNOSIS — I2510 Atherosclerotic heart disease of native coronary artery without angina pectoris: Secondary | ICD-10-CM | POA: Diagnosis not present

## 2018-04-28 DIAGNOSIS — I4891 Unspecified atrial fibrillation: Secondary | ICD-10-CM | POA: Diagnosis not present

## 2018-05-31 DIAGNOSIS — I10 Essential (primary) hypertension: Secondary | ICD-10-CM | POA: Diagnosis not present

## 2018-05-31 DIAGNOSIS — N183 Chronic kidney disease, stage 3 (moderate): Secondary | ICD-10-CM | POA: Diagnosis not present

## 2018-05-31 DIAGNOSIS — G8194 Hemiplegia, unspecified affecting left nondominant side: Secondary | ICD-10-CM | POA: Diagnosis not present

## 2018-05-31 DIAGNOSIS — E559 Vitamin D deficiency, unspecified: Secondary | ICD-10-CM | POA: Diagnosis not present

## 2018-05-31 DIAGNOSIS — N401 Enlarged prostate with lower urinary tract symptoms: Secondary | ICD-10-CM | POA: Diagnosis not present

## 2018-05-31 DIAGNOSIS — K210 Gastro-esophageal reflux disease with esophagitis: Secondary | ICD-10-CM | POA: Diagnosis not present

## 2018-05-31 DIAGNOSIS — I4891 Unspecified atrial fibrillation: Secondary | ICD-10-CM | POA: Diagnosis not present

## 2018-05-31 DIAGNOSIS — I2510 Atherosclerotic heart disease of native coronary artery without angina pectoris: Secondary | ICD-10-CM | POA: Diagnosis not present

## 2018-05-31 DIAGNOSIS — J449 Chronic obstructive pulmonary disease, unspecified: Secondary | ICD-10-CM | POA: Diagnosis not present

## 2018-05-31 DIAGNOSIS — G459 Transient cerebral ischemic attack, unspecified: Secondary | ICD-10-CM | POA: Diagnosis not present

## 2018-06-18 DIAGNOSIS — I639 Cerebral infarction, unspecified: Secondary | ICD-10-CM | POA: Diagnosis not present

## 2018-06-18 DIAGNOSIS — M160 Bilateral primary osteoarthritis of hip: Secondary | ICD-10-CM | POA: Diagnosis not present

## 2018-06-18 DIAGNOSIS — R42 Dizziness and giddiness: Secondary | ICD-10-CM | POA: Diagnosis not present

## 2018-06-18 DIAGNOSIS — M25552 Pain in left hip: Secondary | ICD-10-CM | POA: Diagnosis not present

## 2018-07-05 DIAGNOSIS — I4891 Unspecified atrial fibrillation: Secondary | ICD-10-CM | POA: Diagnosis not present

## 2018-07-05 DIAGNOSIS — E559 Vitamin D deficiency, unspecified: Secondary | ICD-10-CM | POA: Diagnosis not present

## 2018-07-05 DIAGNOSIS — M109 Gout, unspecified: Secondary | ICD-10-CM | POA: Diagnosis not present

## 2018-07-05 DIAGNOSIS — E7800 Pure hypercholesterolemia, unspecified: Secondary | ICD-10-CM | POA: Diagnosis not present

## 2018-07-05 DIAGNOSIS — N401 Enlarged prostate with lower urinary tract symptoms: Secondary | ICD-10-CM | POA: Diagnosis not present

## 2018-07-05 DIAGNOSIS — G459 Transient cerebral ischemic attack, unspecified: Secondary | ICD-10-CM | POA: Diagnosis not present

## 2018-07-05 DIAGNOSIS — J449 Chronic obstructive pulmonary disease, unspecified: Secondary | ICD-10-CM | POA: Diagnosis not present

## 2018-07-05 DIAGNOSIS — I10 Essential (primary) hypertension: Secondary | ICD-10-CM | POA: Diagnosis not present

## 2018-07-05 DIAGNOSIS — Z23 Encounter for immunization: Secondary | ICD-10-CM | POA: Diagnosis not present

## 2018-07-05 DIAGNOSIS — K210 Gastro-esophageal reflux disease with esophagitis: Secondary | ICD-10-CM | POA: Diagnosis not present

## 2018-08-09 DIAGNOSIS — Z681 Body mass index (BMI) 19 or less, adult: Secondary | ICD-10-CM | POA: Diagnosis not present

## 2018-08-09 DIAGNOSIS — I4891 Unspecified atrial fibrillation: Secondary | ICD-10-CM | POA: Diagnosis not present

## 2018-08-09 DIAGNOSIS — N401 Enlarged prostate with lower urinary tract symptoms: Secondary | ICD-10-CM | POA: Diagnosis not present

## 2018-08-09 DIAGNOSIS — M109 Gout, unspecified: Secondary | ICD-10-CM | POA: Diagnosis not present

## 2018-08-09 DIAGNOSIS — J449 Chronic obstructive pulmonary disease, unspecified: Secondary | ICD-10-CM | POA: Diagnosis not present

## 2018-08-09 DIAGNOSIS — E7800 Pure hypercholesterolemia, unspecified: Secondary | ICD-10-CM | POA: Diagnosis not present

## 2018-08-09 DIAGNOSIS — I2510 Atherosclerotic heart disease of native coronary artery without angina pectoris: Secondary | ICD-10-CM | POA: Diagnosis not present

## 2018-08-09 DIAGNOSIS — Z716 Tobacco abuse counseling: Secondary | ICD-10-CM | POA: Diagnosis not present

## 2018-08-09 DIAGNOSIS — N183 Chronic kidney disease, stage 3 (moderate): Secondary | ICD-10-CM | POA: Diagnosis not present

## 2018-08-09 DIAGNOSIS — K210 Gastro-esophageal reflux disease with esophagitis: Secondary | ICD-10-CM | POA: Diagnosis not present

## 2018-10-05 DIAGNOSIS — N401 Enlarged prostate with lower urinary tract symptoms: Secondary | ICD-10-CM | POA: Diagnosis not present

## 2018-10-05 DIAGNOSIS — M109 Gout, unspecified: Secondary | ICD-10-CM | POA: Diagnosis not present

## 2018-10-05 DIAGNOSIS — K210 Gastro-esophageal reflux disease with esophagitis: Secondary | ICD-10-CM | POA: Diagnosis not present

## 2018-10-05 DIAGNOSIS — N183 Chronic kidney disease, stage 3 (moderate): Secondary | ICD-10-CM | POA: Diagnosis not present

## 2018-10-05 DIAGNOSIS — E7800 Pure hypercholesterolemia, unspecified: Secondary | ICD-10-CM | POA: Diagnosis not present

## 2018-10-05 DIAGNOSIS — Z681 Body mass index (BMI) 19 or less, adult: Secondary | ICD-10-CM | POA: Diagnosis not present

## 2018-10-05 DIAGNOSIS — I4891 Unspecified atrial fibrillation: Secondary | ICD-10-CM | POA: Diagnosis not present

## 2018-10-05 DIAGNOSIS — I2510 Atherosclerotic heart disease of native coronary artery without angina pectoris: Secondary | ICD-10-CM | POA: Diagnosis not present

## 2018-10-14 DIAGNOSIS — K210 Gastro-esophageal reflux disease with esophagitis: Secondary | ICD-10-CM | POA: Diagnosis not present

## 2018-10-14 DIAGNOSIS — I2510 Atherosclerotic heart disease of native coronary artery without angina pectoris: Secondary | ICD-10-CM | POA: Diagnosis not present

## 2018-10-14 DIAGNOSIS — E7800 Pure hypercholesterolemia, unspecified: Secondary | ICD-10-CM | POA: Diagnosis not present

## 2018-10-14 DIAGNOSIS — Z716 Tobacco abuse counseling: Secondary | ICD-10-CM | POA: Diagnosis not present

## 2018-10-14 DIAGNOSIS — J449 Chronic obstructive pulmonary disease, unspecified: Secondary | ICD-10-CM | POA: Diagnosis not present

## 2018-10-14 DIAGNOSIS — Z681 Body mass index (BMI) 19 or less, adult: Secondary | ICD-10-CM | POA: Diagnosis not present

## 2018-10-14 DIAGNOSIS — I4891 Unspecified atrial fibrillation: Secondary | ICD-10-CM | POA: Diagnosis not present

## 2018-10-14 DIAGNOSIS — M109 Gout, unspecified: Secondary | ICD-10-CM | POA: Diagnosis not present

## 2018-10-14 DIAGNOSIS — N401 Enlarged prostate with lower urinary tract symptoms: Secondary | ICD-10-CM | POA: Diagnosis not present

## 2018-11-04 DIAGNOSIS — Z681 Body mass index (BMI) 19 or less, adult: Secondary | ICD-10-CM | POA: Diagnosis not present

## 2018-11-04 DIAGNOSIS — E7800 Pure hypercholesterolemia, unspecified: Secondary | ICD-10-CM | POA: Diagnosis not present

## 2018-11-04 DIAGNOSIS — N401 Enlarged prostate with lower urinary tract symptoms: Secondary | ICD-10-CM | POA: Diagnosis not present

## 2018-11-04 DIAGNOSIS — K210 Gastro-esophageal reflux disease with esophagitis: Secondary | ICD-10-CM | POA: Diagnosis not present

## 2018-11-04 DIAGNOSIS — Z716 Tobacco abuse counseling: Secondary | ICD-10-CM | POA: Diagnosis not present

## 2018-11-04 DIAGNOSIS — I4891 Unspecified atrial fibrillation: Secondary | ICD-10-CM | POA: Diagnosis not present

## 2018-11-04 DIAGNOSIS — I2510 Atherosclerotic heart disease of native coronary artery without angina pectoris: Secondary | ICD-10-CM | POA: Diagnosis not present

## 2018-11-04 DIAGNOSIS — N183 Chronic kidney disease, stage 3 (moderate): Secondary | ICD-10-CM | POA: Diagnosis not present

## 2018-11-04 DIAGNOSIS — J449 Chronic obstructive pulmonary disease, unspecified: Secondary | ICD-10-CM | POA: Diagnosis not present

## 2018-11-19 ENCOUNTER — Ambulatory Visit: Payer: Medicare HMO | Admitting: Cardiology

## 2019-05-17 DIAGNOSIS — N183 Chronic kidney disease, stage 3 (moderate): Secondary | ICD-10-CM | POA: Diagnosis not present

## 2019-05-17 DIAGNOSIS — J449 Chronic obstructive pulmonary disease, unspecified: Secondary | ICD-10-CM | POA: Diagnosis not present

## 2019-05-17 DIAGNOSIS — N401 Enlarged prostate with lower urinary tract symptoms: Secondary | ICD-10-CM | POA: Diagnosis not present

## 2019-05-17 DIAGNOSIS — R3914 Feeling of incomplete bladder emptying: Secondary | ICD-10-CM | POA: Diagnosis not present

## 2019-05-17 DIAGNOSIS — Z1331 Encounter for screening for depression: Secondary | ICD-10-CM | POA: Diagnosis not present

## 2019-05-17 DIAGNOSIS — Z9181 History of falling: Secondary | ICD-10-CM | POA: Diagnosis not present

## 2019-05-17 DIAGNOSIS — K210 Gastro-esophageal reflux disease with esophagitis: Secondary | ICD-10-CM | POA: Diagnosis not present

## 2019-05-17 DIAGNOSIS — I4891 Unspecified atrial fibrillation: Secondary | ICD-10-CM | POA: Diagnosis not present

## 2019-05-17 DIAGNOSIS — M109 Gout, unspecified: Secondary | ICD-10-CM | POA: Diagnosis not present

## 2019-05-17 DIAGNOSIS — E7800 Pure hypercholesterolemia, unspecified: Secondary | ICD-10-CM | POA: Diagnosis not present

## 2019-05-18 DIAGNOSIS — E559 Vitamin D deficiency, unspecified: Secondary | ICD-10-CM | POA: Diagnosis not present

## 2019-05-18 DIAGNOSIS — E7800 Pure hypercholesterolemia, unspecified: Secondary | ICD-10-CM | POA: Diagnosis not present

## 2019-05-18 DIAGNOSIS — I4891 Unspecified atrial fibrillation: Secondary | ICD-10-CM | POA: Diagnosis not present

## 2019-07-18 DIAGNOSIS — I2510 Atherosclerotic heart disease of native coronary artery without angina pectoris: Secondary | ICD-10-CM | POA: Diagnosis not present

## 2019-07-18 DIAGNOSIS — I4891 Unspecified atrial fibrillation: Secondary | ICD-10-CM | POA: Diagnosis not present

## 2019-07-18 DIAGNOSIS — N183 Chronic kidney disease, stage 3 (moderate): Secondary | ICD-10-CM | POA: Diagnosis not present

## 2019-07-18 DIAGNOSIS — R3914 Feeling of incomplete bladder emptying: Secondary | ICD-10-CM | POA: Diagnosis not present

## 2019-07-18 DIAGNOSIS — J449 Chronic obstructive pulmonary disease, unspecified: Secondary | ICD-10-CM | POA: Diagnosis not present

## 2019-07-18 DIAGNOSIS — Z23 Encounter for immunization: Secondary | ICD-10-CM | POA: Diagnosis not present

## 2019-07-18 DIAGNOSIS — N401 Enlarged prostate with lower urinary tract symptoms: Secondary | ICD-10-CM | POA: Diagnosis not present

## 2019-07-18 DIAGNOSIS — K210 Gastro-esophageal reflux disease with esophagitis: Secondary | ICD-10-CM | POA: Diagnosis not present

## 2019-07-18 DIAGNOSIS — Z716 Tobacco abuse counseling: Secondary | ICD-10-CM | POA: Diagnosis not present

## 2019-07-18 DIAGNOSIS — D649 Anemia, unspecified: Secondary | ICD-10-CM | POA: Diagnosis not present

## 2019-07-18 DIAGNOSIS — Z79899 Other long term (current) drug therapy: Secondary | ICD-10-CM | POA: Diagnosis not present

## 2019-07-18 DIAGNOSIS — M109 Gout, unspecified: Secondary | ICD-10-CM | POA: Diagnosis not present

## 2019-08-22 DIAGNOSIS — N401 Enlarged prostate with lower urinary tract symptoms: Secondary | ICD-10-CM | POA: Diagnosis not present

## 2019-08-22 DIAGNOSIS — I2510 Atherosclerotic heart disease of native coronary artery without angina pectoris: Secondary | ICD-10-CM | POA: Diagnosis not present

## 2019-08-22 DIAGNOSIS — J449 Chronic obstructive pulmonary disease, unspecified: Secondary | ICD-10-CM | POA: Diagnosis not present

## 2019-08-22 DIAGNOSIS — E7800 Pure hypercholesterolemia, unspecified: Secondary | ICD-10-CM | POA: Diagnosis not present

## 2019-08-22 DIAGNOSIS — M109 Gout, unspecified: Secondary | ICD-10-CM | POA: Diagnosis not present

## 2019-08-22 DIAGNOSIS — E559 Vitamin D deficiency, unspecified: Secondary | ICD-10-CM | POA: Diagnosis not present

## 2019-08-22 DIAGNOSIS — I4891 Unspecified atrial fibrillation: Secondary | ICD-10-CM | POA: Diagnosis not present

## 2019-08-22 DIAGNOSIS — R3914 Feeling of incomplete bladder emptying: Secondary | ICD-10-CM | POA: Diagnosis not present

## 2019-08-22 DIAGNOSIS — N183 Chronic kidney disease, stage 3 unspecified: Secondary | ICD-10-CM | POA: Diagnosis not present

## 2019-08-22 DIAGNOSIS — K2100 Gastro-esophageal reflux disease with esophagitis, without bleeding: Secondary | ICD-10-CM | POA: Diagnosis not present

## 2019-08-30 DIAGNOSIS — I4891 Unspecified atrial fibrillation: Secondary | ICD-10-CM | POA: Diagnosis not present

## 2020-02-27 DIAGNOSIS — R69 Illness, unspecified: Secondary | ICD-10-CM | POA: Diagnosis not present

## 2020-02-27 DIAGNOSIS — I252 Old myocardial infarction: Secondary | ICD-10-CM | POA: Diagnosis not present

## 2020-02-27 DIAGNOSIS — Z008 Encounter for other general examination: Secondary | ICD-10-CM | POA: Diagnosis not present

## 2020-02-27 DIAGNOSIS — D6869 Other thrombophilia: Secondary | ICD-10-CM | POA: Diagnosis not present

## 2020-02-27 DIAGNOSIS — J449 Chronic obstructive pulmonary disease, unspecified: Secondary | ICD-10-CM | POA: Diagnosis not present

## 2020-02-27 DIAGNOSIS — M109 Gout, unspecified: Secondary | ICD-10-CM | POA: Diagnosis not present

## 2020-02-27 DIAGNOSIS — I4891 Unspecified atrial fibrillation: Secondary | ICD-10-CM | POA: Diagnosis not present

## 2020-02-27 DIAGNOSIS — I2510 Atherosclerotic heart disease of native coronary artery without angina pectoris: Secondary | ICD-10-CM | POA: Diagnosis not present

## 2020-02-27 DIAGNOSIS — I10 Essential (primary) hypertension: Secondary | ICD-10-CM | POA: Diagnosis not present

## 2020-02-27 DIAGNOSIS — K219 Gastro-esophageal reflux disease without esophagitis: Secondary | ICD-10-CM | POA: Diagnosis not present

## 2020-02-27 DIAGNOSIS — E785 Hyperlipidemia, unspecified: Secondary | ICD-10-CM | POA: Diagnosis not present

## 2020-06-26 DIAGNOSIS — E785 Hyperlipidemia, unspecified: Secondary | ICD-10-CM | POA: Diagnosis not present

## 2020-06-26 DIAGNOSIS — J449 Chronic obstructive pulmonary disease, unspecified: Secondary | ICD-10-CM | POA: Diagnosis not present

## 2020-06-26 DIAGNOSIS — R110 Nausea: Secondary | ICD-10-CM | POA: Diagnosis not present

## 2020-06-26 DIAGNOSIS — M109 Gout, unspecified: Secondary | ICD-10-CM | POA: Diagnosis not present

## 2020-06-26 DIAGNOSIS — K219 Gastro-esophageal reflux disease without esophagitis: Secondary | ICD-10-CM | POA: Diagnosis not present

## 2020-06-26 DIAGNOSIS — R3914 Feeling of incomplete bladder emptying: Secondary | ICD-10-CM | POA: Diagnosis not present

## 2020-06-26 DIAGNOSIS — E559 Vitamin D deficiency, unspecified: Secondary | ICD-10-CM | POA: Diagnosis not present

## 2020-06-26 DIAGNOSIS — I4891 Unspecified atrial fibrillation: Secondary | ICD-10-CM | POA: Diagnosis not present

## 2020-06-26 DIAGNOSIS — N401 Enlarged prostate with lower urinary tract symptoms: Secondary | ICD-10-CM | POA: Diagnosis not present

## 2020-06-26 DIAGNOSIS — Z681 Body mass index (BMI) 19 or less, adult: Secondary | ICD-10-CM | POA: Diagnosis not present

## 2020-07-10 DIAGNOSIS — N289 Disorder of kidney and ureter, unspecified: Secondary | ICD-10-CM | POA: Diagnosis not present

## 2020-07-10 DIAGNOSIS — I4891 Unspecified atrial fibrillation: Secondary | ICD-10-CM | POA: Diagnosis not present

## 2020-08-09 DIAGNOSIS — Z23 Encounter for immunization: Secondary | ICD-10-CM | POA: Diagnosis not present

## 2020-08-09 DIAGNOSIS — I4891 Unspecified atrial fibrillation: Secondary | ICD-10-CM | POA: Diagnosis not present

## 2020-08-09 DIAGNOSIS — Z681 Body mass index (BMI) 19 or less, adult: Secondary | ICD-10-CM | POA: Diagnosis not present

## 2020-10-29 DIAGNOSIS — H524 Presbyopia: Secondary | ICD-10-CM | POA: Diagnosis not present

## 2020-10-29 DIAGNOSIS — H5213 Myopia, bilateral: Secondary | ICD-10-CM | POA: Diagnosis not present

## 2020-11-12 DIAGNOSIS — R0989 Other specified symptoms and signs involving the circulatory and respiratory systems: Secondary | ICD-10-CM | POA: Diagnosis not present

## 2020-11-12 DIAGNOSIS — J449 Chronic obstructive pulmonary disease, unspecified: Secondary | ICD-10-CM | POA: Diagnosis not present

## 2020-11-12 DIAGNOSIS — D510 Vitamin B12 deficiency anemia due to intrinsic factor deficiency: Secondary | ICD-10-CM | POA: Diagnosis not present

## 2020-11-12 DIAGNOSIS — D649 Anemia, unspecified: Secondary | ICD-10-CM | POA: Diagnosis not present

## 2020-11-12 DIAGNOSIS — I4821 Permanent atrial fibrillation: Secondary | ICD-10-CM | POA: Diagnosis not present

## 2020-11-12 DIAGNOSIS — E782 Mixed hyperlipidemia: Secondary | ICD-10-CM | POA: Diagnosis not present

## 2020-11-12 DIAGNOSIS — I10 Essential (primary) hypertension: Secondary | ICD-10-CM | POA: Diagnosis not present

## 2020-11-12 DIAGNOSIS — K219 Gastro-esophageal reflux disease without esophagitis: Secondary | ICD-10-CM | POA: Diagnosis not present

## 2020-11-12 DIAGNOSIS — Z131 Encounter for screening for diabetes mellitus: Secondary | ICD-10-CM | POA: Diagnosis not present

## 2020-11-12 DIAGNOSIS — M1A00X0 Idiopathic chronic gout, unspecified site, without tophus (tophi): Secondary | ICD-10-CM | POA: Diagnosis not present

## 2020-11-12 DIAGNOSIS — I2510 Atherosclerotic heart disease of native coronary artery without angina pectoris: Secondary | ICD-10-CM | POA: Diagnosis not present

## 2020-11-14 DIAGNOSIS — I6523 Occlusion and stenosis of bilateral carotid arteries: Secondary | ICD-10-CM | POA: Diagnosis not present

## 2020-11-14 DIAGNOSIS — R0989 Other specified symptoms and signs involving the circulatory and respiratory systems: Secondary | ICD-10-CM | POA: Diagnosis not present

## 2020-11-19 DIAGNOSIS — I6523 Occlusion and stenosis of bilateral carotid arteries: Secondary | ICD-10-CM | POA: Diagnosis not present

## 2020-11-19 DIAGNOSIS — R413 Other amnesia: Secondary | ICD-10-CM | POA: Diagnosis not present

## 2021-05-15 DIAGNOSIS — I4821 Permanent atrial fibrillation: Secondary | ICD-10-CM | POA: Diagnosis not present

## 2021-05-15 DIAGNOSIS — R7301 Impaired fasting glucose: Secondary | ICD-10-CM | POA: Diagnosis not present

## 2021-05-15 DIAGNOSIS — I2510 Atherosclerotic heart disease of native coronary artery without angina pectoris: Secondary | ICD-10-CM | POA: Diagnosis not present

## 2021-05-15 DIAGNOSIS — I110 Hypertensive heart disease with heart failure: Secondary | ICD-10-CM | POA: Diagnosis not present

## 2021-05-15 DIAGNOSIS — Z0000 Encounter for general adult medical examination without abnormal findings: Secondary | ICD-10-CM | POA: Diagnosis not present

## 2021-05-15 DIAGNOSIS — I10 Essential (primary) hypertension: Secondary | ICD-10-CM | POA: Diagnosis not present

## 2021-05-15 DIAGNOSIS — D649 Anemia, unspecified: Secondary | ICD-10-CM | POA: Diagnosis not present

## 2021-05-15 DIAGNOSIS — E782 Mixed hyperlipidemia: Secondary | ICD-10-CM | POA: Diagnosis not present

## 2021-05-15 DIAGNOSIS — R413 Other amnesia: Secondary | ICD-10-CM | POA: Diagnosis not present

## 2021-05-15 DIAGNOSIS — I5020 Unspecified systolic (congestive) heart failure: Secondary | ICD-10-CM | POA: Diagnosis not present

## 2021-05-15 DIAGNOSIS — J449 Chronic obstructive pulmonary disease, unspecified: Secondary | ICD-10-CM | POA: Diagnosis not present

## 2021-05-15 DIAGNOSIS — M1A00X0 Idiopathic chronic gout, unspecified site, without tophus (tophi): Secondary | ICD-10-CM | POA: Diagnosis not present

## 2021-05-23 DIAGNOSIS — R079 Chest pain, unspecified: Secondary | ICD-10-CM | POA: Diagnosis not present

## 2021-05-23 DIAGNOSIS — Z951 Presence of aortocoronary bypass graft: Secondary | ICD-10-CM | POA: Diagnosis not present

## 2021-05-23 DIAGNOSIS — I493 Ventricular premature depolarization: Secondary | ICD-10-CM | POA: Diagnosis not present

## 2021-05-23 DIAGNOSIS — I2510 Atherosclerotic heart disease of native coronary artery without angina pectoris: Secondary | ICD-10-CM | POA: Diagnosis not present

## 2021-05-23 DIAGNOSIS — E782 Mixed hyperlipidemia: Secondary | ICD-10-CM | POA: Diagnosis not present

## 2021-05-23 DIAGNOSIS — R413 Other amnesia: Secondary | ICD-10-CM | POA: Diagnosis not present

## 2021-05-23 DIAGNOSIS — Z7982 Long term (current) use of aspirin: Secondary | ICD-10-CM | POA: Diagnosis not present

## 2021-05-23 DIAGNOSIS — I4821 Permanent atrial fibrillation: Secondary | ICD-10-CM | POA: Diagnosis not present

## 2021-05-23 DIAGNOSIS — I255 Ischemic cardiomyopathy: Secondary | ICD-10-CM | POA: Diagnosis not present

## 2021-05-23 DIAGNOSIS — I10 Essential (primary) hypertension: Secondary | ICD-10-CM | POA: Diagnosis not present

## 2021-05-23 DIAGNOSIS — R69 Illness, unspecified: Secondary | ICD-10-CM | POA: Diagnosis not present

## 2021-06-17 DIAGNOSIS — D649 Anemia, unspecified: Secondary | ICD-10-CM | POA: Diagnosis not present

## 2021-07-02 DIAGNOSIS — R002 Palpitations: Secondary | ICD-10-CM | POA: Diagnosis not present

## 2021-07-02 DIAGNOSIS — I4821 Permanent atrial fibrillation: Secondary | ICD-10-CM | POA: Diagnosis not present

## 2021-07-02 DIAGNOSIS — I2510 Atherosclerotic heart disease of native coronary artery without angina pectoris: Secondary | ICD-10-CM | POA: Diagnosis not present

## 2021-07-02 DIAGNOSIS — I255 Ischemic cardiomyopathy: Secondary | ICD-10-CM | POA: Diagnosis not present

## 2021-07-03 DIAGNOSIS — I255 Ischemic cardiomyopathy: Secondary | ICD-10-CM | POA: Diagnosis not present

## 2021-07-03 DIAGNOSIS — I4821 Permanent atrial fibrillation: Secondary | ICD-10-CM | POA: Diagnosis not present

## 2021-07-03 DIAGNOSIS — I2510 Atherosclerotic heart disease of native coronary artery without angina pectoris: Secondary | ICD-10-CM | POA: Diagnosis not present

## 2021-07-08 DIAGNOSIS — I2510 Atherosclerotic heart disease of native coronary artery without angina pectoris: Secondary | ICD-10-CM | POA: Diagnosis not present

## 2021-07-08 DIAGNOSIS — I4821 Permanent atrial fibrillation: Secondary | ICD-10-CM | POA: Diagnosis not present

## 2021-07-08 DIAGNOSIS — I255 Ischemic cardiomyopathy: Secondary | ICD-10-CM | POA: Diagnosis not present

## 2024-09-19 ENCOUNTER — Ambulatory Visit

## 2024-09-19 ENCOUNTER — Other Ambulatory Visit: Payer: Self-pay

## 2024-09-19 DIAGNOSIS — M25439 Effusion, unspecified wrist: Secondary | ICD-10-CM | POA: Insufficient documentation

## 2024-09-19 DIAGNOSIS — K219 Gastro-esophageal reflux disease without esophagitis: Secondary | ICD-10-CM | POA: Insufficient documentation

## 2024-09-19 DIAGNOSIS — R1013 Epigastric pain: Secondary | ICD-10-CM | POA: Insufficient documentation

## 2024-09-19 DIAGNOSIS — I2510 Atherosclerotic heart disease of native coronary artery without angina pectoris: Secondary | ICD-10-CM | POA: Insufficient documentation

## 2024-09-19 DIAGNOSIS — R7303 Prediabetes: Secondary | ICD-10-CM | POA: Insufficient documentation

## 2024-09-19 DIAGNOSIS — R110 Nausea: Secondary | ICD-10-CM | POA: Insufficient documentation

## 2024-09-19 DIAGNOSIS — E785 Hyperlipidemia, unspecified: Secondary | ICD-10-CM | POA: Insufficient documentation

## 2024-09-19 DIAGNOSIS — I10 Essential (primary) hypertension: Secondary | ICD-10-CM | POA: Insufficient documentation

## 2024-09-19 DIAGNOSIS — R739 Hyperglycemia, unspecified: Secondary | ICD-10-CM | POA: Insufficient documentation

## 2024-09-19 DIAGNOSIS — K2100 Gastro-esophageal reflux disease with esophagitis, without bleeding: Secondary | ICD-10-CM | POA: Insufficient documentation

## 2024-09-19 DIAGNOSIS — I4821 Permanent atrial fibrillation: Secondary | ICD-10-CM | POA: Insufficient documentation

## 2024-09-19 DIAGNOSIS — I6523 Occlusion and stenosis of bilateral carotid arteries: Secondary | ICD-10-CM | POA: Insufficient documentation

## 2024-09-19 DIAGNOSIS — L03119 Cellulitis of unspecified part of limb: Secondary | ICD-10-CM | POA: Insufficient documentation

## 2024-09-19 DIAGNOSIS — H547 Unspecified visual loss: Secondary | ICD-10-CM | POA: Insufficient documentation

## 2024-09-19 DIAGNOSIS — K4020 Bilateral inguinal hernia, without obstruction or gangrene, not specified as recurrent: Secondary | ICD-10-CM | POA: Insufficient documentation

## 2024-09-19 DIAGNOSIS — R5382 Chronic fatigue, unspecified: Secondary | ICD-10-CM | POA: Insufficient documentation

## 2024-09-19 DIAGNOSIS — E7800 Pure hypercholesterolemia, unspecified: Secondary | ICD-10-CM | POA: Insufficient documentation

## 2024-09-19 DIAGNOSIS — D519 Vitamin B12 deficiency anemia, unspecified: Secondary | ICD-10-CM | POA: Insufficient documentation

## 2024-09-19 DIAGNOSIS — I739 Peripheral vascular disease, unspecified: Secondary | ICD-10-CM | POA: Insufficient documentation

## 2024-09-19 DIAGNOSIS — I5020 Unspecified systolic (congestive) heart failure: Secondary | ICD-10-CM | POA: Insufficient documentation

## 2024-09-19 DIAGNOSIS — B029 Zoster without complications: Secondary | ICD-10-CM | POA: Insufficient documentation

## 2024-09-19 DIAGNOSIS — R197 Diarrhea, unspecified: Secondary | ICD-10-CM | POA: Insufficient documentation

## 2024-09-19 DIAGNOSIS — J449 Chronic obstructive pulmonary disease, unspecified: Secondary | ICD-10-CM | POA: Insufficient documentation

## 2024-09-19 DIAGNOSIS — J439 Emphysema, unspecified: Secondary | ICD-10-CM | POA: Insufficient documentation

## 2024-09-19 DIAGNOSIS — Z79899 Other long term (current) drug therapy: Secondary | ICD-10-CM | POA: Insufficient documentation

## 2024-09-19 DIAGNOSIS — M545 Low back pain, unspecified: Secondary | ICD-10-CM | POA: Insufficient documentation

## 2024-09-19 DIAGNOSIS — M25539 Pain in unspecified wrist: Secondary | ICD-10-CM | POA: Insufficient documentation

## 2024-09-19 DIAGNOSIS — R634 Abnormal weight loss: Secondary | ICD-10-CM | POA: Insufficient documentation

## 2024-09-19 DIAGNOSIS — D649 Anemia, unspecified: Secondary | ICD-10-CM | POA: Insufficient documentation

## 2024-09-19 DIAGNOSIS — N390 Urinary tract infection, site not specified: Secondary | ICD-10-CM | POA: Insufficient documentation

## 2024-09-19 DIAGNOSIS — M109 Gout, unspecified: Secondary | ICD-10-CM | POA: Insufficient documentation

## 2024-09-19 DIAGNOSIS — Z1331 Encounter for screening for depression: Secondary | ICD-10-CM | POA: Insufficient documentation

## 2024-09-21 ENCOUNTER — Encounter: Payer: Self-pay | Admitting: Cardiology

## 2024-09-21 ENCOUNTER — Ambulatory Visit: Attending: Cardiology | Admitting: Cardiology

## 2024-09-21 VITALS — BP 138/78 | HR 108 | Ht 73.0 in | Wt 144.0 lb

## 2024-09-21 DIAGNOSIS — R011 Cardiac murmur, unspecified: Secondary | ICD-10-CM | POA: Diagnosis not present

## 2024-09-21 DIAGNOSIS — E782 Mixed hyperlipidemia: Secondary | ICD-10-CM

## 2024-09-21 DIAGNOSIS — F17210 Nicotine dependence, cigarettes, uncomplicated: Secondary | ICD-10-CM

## 2024-09-21 DIAGNOSIS — I2510 Atherosclerotic heart disease of native coronary artery without angina pectoris: Secondary | ICD-10-CM

## 2024-09-21 MED ORDER — APIXABAN 5 MG PO TABS: 5.0000 mg | ORAL_TABLET | Freq: Two times a day (BID) | ORAL | 3 refills | Status: AC

## 2024-09-21 NOTE — Progress Notes (Signed)
 Cardiology Office Note:    Date:  09/21/2024   ID:  Colin Reeves, DOB 17-Sep-1936, MRN [id number]  PCP:  Georgia Norleen FALCON., MD  Cardiologist:  Jennifer JONELLE Crape, MD   Referring MD: Ofilia Lamar CROME, MD    ASSESSMENT:    1. Mixed dyslipidemia    PLAN:    In order of problems listed above:  Coronary artery disease: Secondary prevention stressed with the patient.  Importance of compliance with diet medication stressed and patient verbalized standing. Permanent atrial fibrillation:I discussed with the patient atrial fibrillation, disease process. Management and therapy including rate and rhythm control, anticoagulation benefits and potential risks were discussed extensively with the patient. Patient had multiple questions which were answered to patient's satisfaction.  I spoke to Dr. Ofilia and we do not see any contraindication for anticoagulation.  I will initiate him on Eliquis.  Will give him and Ifob to check for stool occult blood and Chem-7 and CBC in 2 weeks. Mixed dyslipidemia: On lipid-lowering medications followed by primary care.  I reviewed lipids from Dr. Benjiman records and he is at goal. Cigarette smoker: I spent 5 minutes with the patient discussing solely about smoking. Smoking cessation was counseled. I suggested to the patient also different medications and pharmacological interventions. Patient is keen to try stopping on its own at this time. He will get back to me if he needs any further assistance in this matter. Cardiac murmur: Echocardiogram will be done to assess murmur heard on auscultation. Patient will be seen in follow-up appointment in 6 months or earlier if the patient has any concerns.    Medication Adjustments/Labs and Tests Ordered: Current medicines are reviewed at length with the patient today.  Concerns regarding medicines are outlined above.  Orders Placed This Encounter  Procedures   EKG 12-Lead   No orders of the defined types were placed in this  encounter.    History of Present Illness:    Colin Reeves is a 88 y.o. male who is being seen today for the evaluation of coronary artery disease and permanent atrial fibrillation at the request of Dough, Lamar CROME, MD. patient is a pleasant 88 year old male.  He has past medical history of coronary artery disease, mixed dyslipidemia and COPD.  Unfortunately he continues to smoke.  He has history of atrial fibrillation.  He denies any chest pain orthopnea or PND.  He is here to be established.  At the time of my evaluation, the patient is alert awake oriented and in no distress.  Unfortunately continues to smoke  Past Medical History:  Diagnosis Date   Acute lower urinary tract infection    Anemia of unknown etiology    Anemia, vitamin B12 deficiency    Atherosclerosis of both carotid arteries    CAD (coronary artery disease) 03/23/2018   CAD (coronary artery disease), native coronary artery    Cellulitis of arm    Chronic coronary artery disease    Chronic fatigue    Chronic reflux esophagitis    Cigarette smoker 03/23/2018   COPD, moderate (HCC)    Depression screening    Diarrhea    Elevated blood sugar    Epigastric pain    Esophageal reflux    Essential hypertension 03/23/2018   GERD without esophagitis    Gout    Heart failure with reduced ejection fraction (HCC)    Hx of CABG 03/23/2018   Hyperlipidemia    Hypertension, essential, benign    HZ (herpes zoster)  Inguinal hernia, bilateral    Long-term use of high-risk medication    Lumbago    Mixed dyslipidemia 03/23/2018   Nausea    Peripheral artery disease    Permanent atrial fibrillation (HCC)    Prediabetes    Pulmonary emphysema (HCC)    Pure hypercholesterolemia    Unspecified atrial fibrillation (HCC) 03/23/2018   Vision loss    Weight loss    Wrist joint pain    Wrist swelling     Past Surgical History:  Procedure Laterality Date   GALLBLADDER SURGERY     triple bypass      Current  Medications: Current Meds  Medication Sig   albuterol  (VENTOLIN  HFA) 108 (90 Base) MCG/ACT inhaler Inhale 2 puffs into the lungs every 6 (six) hours as needed for wheezing or shortness of breath.   aspirin 81 MG EC tablet Take 81 mg by mouth daily. Swallow whole.   atorvastatin (LIPITOR) 80 MG tablet Take 80 mg by mouth daily.   Fluticasone-Umeclidin-Vilant (TRELEGY ELLIPTA) 100-62.5-25 MCG/ACT AEPB Take 1 puff by mouth daily.   nitroGLYCERIN (NITROSTAT) 0.4 MG SL tablet Place 0.4 mg under the tongue every 5 (five) minutes as needed.   omeprazole (PRILOSEC) 40 MG capsule Take 40 mg by mouth daily.   ondansetron (ZOFRAN) 4 MG tablet Take 4 mg by mouth every 4 (four) hours as needed for nausea.   tamsulosin (FLOMAX) 0.4 MG CAPS capsule Take 0.4 mg by mouth daily.   Vitamin D, Ergocalciferol, (DRISDOL) 1.25 MG (50000 UNIT) CAPS capsule Take 50,000 Units by mouth every 7 (seven) days.     Allergies:   Patient has no known allergies.   Social History   Socioeconomic History   Marital status: Married    Spouse name: Not on file   Number of children: Not on file   Years of education: Not on file   Highest education level: Not on file  Occupational History   Not on file  Tobacco Use   Smoking status: Every Day   Smokeless tobacco: Never  Substance and Sexual Activity   Alcohol use: Not on file   Drug use: Not on file   Sexual activity: Not on file  Other Topics Concern   Not on file  Social History Narrative   Not on file   Social Drivers of Health   Financial Resource Strain: Not on file  Food Insecurity: Low Risk  (09/01/2024)   Received from [facility]   Hunger Vital Sign    Within the past 12 months, you worried that your food would run out before you got money to buy more: Never true    Within the past 12 months, the food you bought just didn't last and you didn't have money to get more. : Never true  Transportation Needs: No Transportation Needs (09/01/2024)   Received  from [facility]    In the past 12 months, has lack of reliable transportation kept you from medical appointments, meetings, work or from getting things needed for daily living? : No  Physical Activity: Not on file  Stress: Not on file  Social Connections: Not on file     Family History: The patient's family history includes Cancer in his mother; Diabetes in his father and sister; Throat cancer in his brother.  ROS:   Please see the history of present illness.    All other systems reviewed and are negative.  EKGs/Labs/Other Studies Reviewed:    The following studies were  reviewed today: .SABRAEKG Interpretation Date/Time:  Wednesday September 21 2024 13:52:29 EST Ventricular Rate:  108 PR Interval:    QRS Duration:  90 QT Interval:  330 QTC Calculation: 442 R Axis:   72  Text Interpretation: Atrial fibrillation with rapid ventricular response Anterior infarct , age undetermined T wave abnormality, consider inferolateral ischemia Abnormal ECG When compared with ECG of 17-Dec-2001 07:21, Significant changes have occurred Confirmed by Edwyna Backers [phone]) on 09/21/2024 2:19:43 PM         Recent Labs: No results found for requested labs within last 365 days.  Recent Lipid Panel No results found for: CHOL, TRIG, HDL, CHOLHDL, VLDL, LDLCALC, LDLDIRECT  Physical Exam:    VS:  BP 138/78   Pulse (!) 108   Ht 6' 1 (1.854 m)   Wt 144 lb (65.3 kg)   SpO2 97%   BMI 19.00 kg/m     Wt Readings from Last 3 Encounters:  09/21/24 144 lb (65.3 kg)  04/15/18 144 lb (65.3 kg)  03/30/18 144 lb (65.3 kg)     GEN: Patient is in no acute distress HEENT: Normal NECK: No JVD; No carotid bruits LYMPHATICS: No lymphadenopathy CARDIAC: S1 S2 regular, 2/6 systolic murmur at the apex. RESPIRATORY:  Clear to auscultation without rales, wheezing or rhonchi  ABDOMEN: Soft, non-tender, non-distended MUSCULOSKELETAL:  No edema; No deformity  SKIN: Warm and  dry NEUROLOGIC:  Alert and oriented x 3 PSYCHIATRIC:  Normal affect    Signed, Backers JONELLE Edwyna, MD  09/21/2024 2:15 PM    [facility]

## 2024-09-21 NOTE — Patient Instructions (Addendum)
 Medication Instructions:  Your physician has recommended you make the following change in your medication:  Start taking Eliquis 5 mg two times daily  Please refer to the Current Medication list given to you today.  *If you need a refill on your cardiac medications before your next appointment, please call your pharmacy*   Lab Work: Your physician has recommended you make the following change in your medication: 2 weeks BMP, CBC, IFOB   Testing/Procedures: Your physician has requested that you have an echocardiogram. Echocardiography is a painless test that uses sound waves to create images of your heart. It provides your doctor with information about the size and shape of your heart and how well your heart's chambers and valves are working. This procedure takes approximately one hour. There are no restrictions for this procedure. Please do NOT wear cologne, perfume, aftershave, or lotions (deodorant is allowed). Please arrive 15 minutes prior to your appointment time.  Please note: We ask at that you not bring children with you during ultrasound (echo/ vascular) testing. Due to room size and safety concerns, children are not allowed in the ultrasound rooms during exams. Our front office staff cannot provide observation of children in our lobby area while testing is being conducted. An adult accompanying a patient to their appointment will only be allowed in the ultrasound room at the discretion of the ultrasound technician under special circumstances. We apologize for any inconvenience.  Follow-Up: At [facility], you and your health needs are our priority.  As part of our continuing mission to provide you with exceptional heart care, we have created designated Provider Care Teams.  These Care Teams include your primary Cardiologist (physician) and Advanced Practice Providers (APPs -  Physician Assistants and Nurse Practitioners) who all work together to provide you with the care you need,  when you need it.  We recommend signing up for the patient portal called MyChart.  Sign up information is provided on this After Visit Summary.  MyChart is used to connect with patients for Virtual Visits (Telemedicine).  Patients are able to view lab/test results, encounter notes, upcoming appointments, etc.  Non-urgent messages can be sent to your provider as well.   To learn more about what you can do with MyChart, go to [email or web address].    Your next appointment:   2 month(s)  The format for your next appointment:   In Person  Provider:   Jennifer Crape, MD   Other Instructions Echocardiogram An echocardiogram is a test that uses sound waves (ultrasound) to produce images of the heart. Images from an echocardiogram can provide important information about: Heart size and shape. The size and thickness and movement of your heart's walls. Heart muscle function and strength. Heart valve function or if you have stenosis. Stenosis is when the heart valves are too narrow. If blood is flowing backward through the heart valves (regurgitation). A tumor or infectious growth around the heart valves. Areas of heart muscle that are not working well because of poor blood flow or injury from a heart attack. Aneurysm detection. An aneurysm is a weak or damaged part of an artery wall. The wall bulges out from the normal force of blood pumping through the body. Tell a health care provider about: Any allergies you have. All medicines you are taking, including vitamins, herbs, eye drops, creams, and over-the-counter medicines. Any blood disorders you have. Any surgeries you have had. Any medical conditions you have. Whether you are pregnant or may be pregnant. What are the  risks? Generally, this is a safe test. However, problems may occur, including an allergic reaction to dye (contrast) that may be used during the test. What happens before the test? No specific preparation is needed. You  may eat and drink normally. What happens during the test? You will take off your clothes from the waist up and put on a hospital gown. Electrodes or electrocardiogram (ECG)patches may be placed on your chest. The electrodes or patches are then connected to a device that monitors your heart rate and rhythm. You will lie down on a table for an ultrasound exam. A gel will be applied to your chest to help sound waves pass through your skin. A handheld device, called a transducer, will be pressed against your chest and moved over your heart. The transducer produces sound waves that travel to your heart and bounce back (or echo back) to the transducer. These sound waves will be captured in real-time and changed into images of your heart that can be viewed on a video monitor. The images will be recorded on a computer and reviewed by your health care provider. You may be asked to change positions or hold your breath for a short time. This makes it easier to get different views or better views of your heart. In some cases, you may receive contrast through an IV in one of your veins. This can improve the quality of the pictures from your heart. The procedure may vary among health care providers and hospitals.   What can I expect after the test? You may return to your normal, everyday life, including diet, activities, and medicines, unless your health care provider tells you not to do that. Follow these instructions at home: It is up to you to get the results of your test. Ask your health care provider, or the department that is doing the test, when your results will be ready. Keep all follow-up visits. This is important. Summary An echocardiogram is a test that uses sound waves (ultrasound) to produce images of the heart. Images from an echocardiogram can provide important information about the size and shape of your heart, heart muscle function, heart valve function, and other possible heart problems. You  do not need to do anything to prepare before this test. You may eat and drink normally. After the echocardiogram is completed, you may return to your normal, everyday life, unless your health care provider tells you not to do that. This information is not intended to replace advice given to you by your health care provider. Make sure you discuss any questions you have with your health care provider. Document Revised: 05/29/2020 Document Reviewed: 05/29/2020 [employer]vier Patient Education  2021 [employer]   Important Information About Sugar

## 2024-09-21 NOTE — Addendum Note (Signed)
 Addended by: GLENFORD ALAN CROME on: 09/21/2024 02:54 PM   Modules accepted: Orders

## 2024-10-25 ENCOUNTER — Ambulatory Visit: Attending: Cardiology

## 2024-10-25 ENCOUNTER — Ambulatory Visit: Payer: Self-pay | Admitting: Cardiology

## 2024-10-25 DIAGNOSIS — I2510 Atherosclerotic heart disease of native coronary artery without angina pectoris: Secondary | ICD-10-CM | POA: Diagnosis not present

## 2024-10-25 DIAGNOSIS — R011 Cardiac murmur, unspecified: Secondary | ICD-10-CM | POA: Diagnosis not present

## 2024-10-25 DIAGNOSIS — E782 Mixed hyperlipidemia: Secondary | ICD-10-CM

## 2024-10-25 LAB — ECHOCARDIOGRAM COMPLETE
AR max vel: 1.6 cm2
AV Area VTI: 1.32 cm2
AV Area mean vel: 1.5 cm2
AV Mean grad: 5 mmHg
AV Peak grad: 9.1 mmHg
Ao pk vel: 1.51 m/s
Area-P 1/2: 8.82 cm2
MV M vel: 2.81 m/s
MV Peak grad: 31.6 mmHg
MV VTI: 1.6 cm2
P 1/2 time: 242 ms
S' Lateral: 3.3 cm

## 2024-11-22 ENCOUNTER — Ambulatory Visit: Admitting: Cardiology

## 2024-12-01 ENCOUNTER — Ambulatory Visit: Admitting: Cardiology
